# Patient Record
Sex: Male | Born: 1950
Health system: Southern US, Community
[De-identification: ages and names within clinical notes are randomized; demographics above are authoritative.]

## PROBLEM LIST (undated history)

## (undated) DIAGNOSIS — C61 Malignant neoplasm of prostate: Secondary | ICD-10-CM

## (undated) DIAGNOSIS — Z973 Presence of spectacles and contact lenses: Secondary | ICD-10-CM

## (undated) DIAGNOSIS — I10 Essential (primary) hypertension: Secondary | ICD-10-CM

## (undated) HISTORY — PX: COLONOSCOPY: SHX174

## (undated) HISTORY — PX: PROSTATE BIOPSY: SHX241

---

## 1990-03-14 HISTORY — PX: OTHER SURGICAL HISTORY: SHX169

## 2009-08-11 ENCOUNTER — Encounter: Admission: RE | Admit: 2009-08-11 | Discharge: 2009-08-11 | Payer: Self-pay | Admitting: Family Medicine

## 2014-01-21 ENCOUNTER — Emergency Department (HOSPITAL_COMMUNITY)
Admission: EM | Admit: 2014-01-21 | Discharge: 2014-01-21 | Disposition: A | Discharge status: Home / Self Care | Payer: BC Managed Care – PPO | Attending: Emergency Medicine | Admitting: Emergency Medicine

## 2014-01-21 ENCOUNTER — Encounter (HOSPITAL_COMMUNITY): Payer: Self-pay | Admitting: Emergency Medicine

## 2014-01-21 ENCOUNTER — Emergency Department (HOSPITAL_COMMUNITY): Payer: BC Managed Care – PPO

## 2014-01-21 DIAGNOSIS — Y9289 Other specified places as the place of occurrence of the external cause: Secondary | ICD-10-CM | POA: Insufficient documentation

## 2014-01-21 DIAGNOSIS — Y9389 Activity, other specified: Secondary | ICD-10-CM | POA: Insufficient documentation

## 2014-01-21 DIAGNOSIS — S61216A Laceration without foreign body of right little finger without damage to nail, initial encounter: Secondary | ICD-10-CM | POA: Insufficient documentation

## 2014-01-21 DIAGNOSIS — Y990 Civilian activity done for income or pay: Secondary | ICD-10-CM | POA: Insufficient documentation

## 2014-01-21 DIAGNOSIS — W11XXXA Fall on and from ladder, initial encounter: Secondary | ICD-10-CM | POA: Insufficient documentation

## 2014-01-21 DIAGNOSIS — S61214A Laceration without foreign body of right ring finger without damage to nail, initial encounter: Secondary | ICD-10-CM | POA: Insufficient documentation

## 2014-01-21 DIAGNOSIS — S3992XA Unspecified injury of lower back, initial encounter: Secondary | ICD-10-CM | POA: Insufficient documentation

## 2014-01-21 DIAGNOSIS — I10 Essential (primary) hypertension: Secondary | ICD-10-CM | POA: Insufficient documentation

## 2014-01-21 DIAGNOSIS — M79643 Pain in unspecified hand: Secondary | ICD-10-CM

## 2014-01-21 DIAGNOSIS — Z7982 Long term (current) use of aspirin: Secondary | ICD-10-CM | POA: Insufficient documentation

## 2014-01-21 DIAGNOSIS — S66821A Laceration of other specified muscles, fascia and tendons at wrist and hand level, right hand, initial encounter: Secondary | ICD-10-CM | POA: Insufficient documentation

## 2014-01-21 DIAGNOSIS — S61209A Unspecified open wound of unspecified finger without damage to nail, initial encounter: Secondary | ICD-10-CM

## 2014-01-21 DIAGNOSIS — W231XXA Caught, crushed, jammed, or pinched between stationary objects, initial encounter: Secondary | ICD-10-CM | POA: Insufficient documentation

## 2014-01-21 DIAGNOSIS — Z79899 Other long term (current) drug therapy: Secondary | ICD-10-CM | POA: Insufficient documentation

## 2014-01-21 DIAGNOSIS — Z880 Allergy status to penicillin: Secondary | ICD-10-CM | POA: Insufficient documentation

## 2014-01-21 DIAGNOSIS — S61419A Laceration without foreign body of unspecified hand, initial encounter: Secondary | ICD-10-CM

## 2014-01-21 DIAGNOSIS — S56129A Laceration of flexor muscle, fascia and tendon of unspecified finger at forearm level, initial encounter: Secondary | ICD-10-CM

## 2014-01-21 HISTORY — DX: Essential (primary) hypertension: I10

## 2014-01-21 MED ORDER — SULFAMETHOXAZOLE-TRIMETHOPRIM 800-160 MG PO TABS: 1.0000 | ORAL_TABLET | Freq: Two times a day (BID) | ORAL | Status: DC

## 2014-01-21 MED ORDER — HYDROCODONE-ACETAMINOPHEN 5-325 MG PO TABS
2.0000 | ORAL_TABLET | Freq: Once | ORAL | Status: AC
  Administered 2014-01-21: 2 via ORAL
  Filled 2014-01-21: qty 2

## 2014-01-21 MED ORDER — LIDOCAINE HCL (PF) 1 % IJ SOLN
30.0000 mL | Freq: Once | INTRAMUSCULAR | Status: DC
  Filled 2014-01-21: qty 30

## 2014-01-21 MED ORDER — HYDROCODONE-ACETAMINOPHEN 5-325 MG PO TABS: 1.0000 | ORAL_TABLET | ORAL | Status: DC | PRN

## 2014-01-21 MED ORDER — FENTANYL CITRATE 0.05 MG/ML IJ SOLN
50.0000 ug | INTRAMUSCULAR | Status: DC | PRN
  Administered 2014-01-21: 50 ug via INTRAVENOUS
  Filled 2014-01-21 (×2): qty 2

## 2014-01-21 NOTE — Discharge Instructions (Signed)
1. Medications: vicodin, bactrim, usual home medications 2. Treatment: rest, drink plenty of fluids, keep splint dry 3. Follow Up: Please Call Dr. Levell July office tomorrow to schedule an appointment for tomorrow or Friday for recheck and repair; return to the emergency department for fevers, worsening symptoms, splint changes if it becomes wet

## 2014-01-21 NOTE — ED Notes (Signed)
Laceration wrapped with lightly damp dressing.

## 2014-01-21 NOTE — ED Notes (Signed)
Hannah PA at bedside suturing hand.

## 2014-01-21 NOTE — ED Notes (Signed)
Pt [facility], pt fell 8 feet, landed on left side, right hand was crushed. Pt given 100 mcg of fentanyl upon arrival. Denies hitting head. Denies LOC. Pt is AAOX4, in NAD. Laceration to right hand, bleeding controlled. Tendons visible. Pt able to move all fingers except pinkie finger. Sensation intact.

## 2014-01-21 NOTE — Progress Notes (Signed)
Orthopedic Tech Progress Note Patient Details:  Logan Wu 09/27/1950 [id number]  Ortho Devices Type of Ortho Device: Ace wrap, Volar splint Ortho Device/Splint Location: RUE Ortho Device/Splint Interventions: Ordered, Application   Braulio Bosch 01/21/2014, 7:30 PM

## 2014-01-21 NOTE — ED Provider Notes (Signed)
CSN: [id number]     Arrival date & time 01/21/14  1153 History   First MD Initiated Contact with Patient 01/21/14 1155     Chief Complaint  Patient presents with  . Trauma  . Hand Injury     (Consider location/radiation/quality/duration/timing/severity/associated sxs/prior Treatment) Patient is a 63 y.o. male presenting with trauma and hand injury. The history is provided by the patient and medical records. No language interpreter was used.  Trauma   Current symptoms:      Associated symptoms:            Reports back pain (low, left).            Denies abdominal pain, chest pain, headache, nausea and vomiting.  Hand Injury Associated symptoms: back pain (low, left)   Associated symptoms: no fatigue and no fever      Logan Wu is a 63 y.o. male  with a hx of HTN presents to the Emergency Department complaining of acute, persistent with right hand laceration after accident at work onset 30 min PTA.  Patient reports he was 8 feet up on a ladder when the ladder fell and the organ cover he was attempting to remove "smashed" his hand.  Pt reports he hung there for several seconds before falling. He denies hitting his head or LOC.  He reports he fell onto his left side and has mild left lower back pain and left elbow pain, however his major concern in the large laceration to the dorsum and palmer sides of the hand.   Associated symptoms include numbness in the right little finger and decreased sensation in the ring and long fingers.  Fentanyl 150mcg given by EMS has alleviated the pain.  Pt denies fever, chills, saddle anesthesia, loss of bowel or bladder control, numbness/weakness in the legs, difficulty walking, syncope, vision changes, headache.      Past Medical History  Diagnosis Date  . Hypertension    Past Surgical History  Procedure Laterality Date  . Disk fusion     No family history on file. History  Substance Use Topics  . Smoking status: Never Smoker   . Smokeless  tobacco: Not on file  . Alcohol Use: Yes    Review of Systems  Constitutional: Negative for fever, diaphoresis, appetite change, fatigue and unexpected weight change.  HENT: Negative for mouth sores.   Eyes: Negative for visual disturbance.  Respiratory: Negative for cough, chest tightness, shortness of breath and wheezing.   Cardiovascular: Negative for chest pain.  Gastrointestinal: Negative for nausea, vomiting, abdominal pain, diarrhea and constipation.  Endocrine: Negative for polydipsia, polyphagia and polyuria.  Genitourinary: Negative for dysuria, urgency, frequency and hematuria.  Musculoskeletal: Positive for back pain (low, left) and arthralgias. Negative for neck stiffness.  Skin: Positive for wound. Negative for rash.  Allergic/Immunologic: Negative for immunocompromised state.  Neurological: Negative for syncope, light-headedness and headaches.  Hematological: Does not bruise/bleed easily.  Psychiatric/Behavioral: Negative for sleep disturbance. The patient is not nervous/anxious.       Allergies  Penicillins  Home Medications   Prior to Admission medications   Medication Sig Start Date End Date Taking? Authorizing Provider  aspirin 81 MG tablet Take 81 mg by mouth daily.   Yes Historical Provider, MD  cholecalciferol (VITAMIN D) 1000 UNITS tablet Take 1,000 Units by mouth daily.   Yes Historical Provider, MD  lisinopril (PRINIVIL,ZESTRIL) 40 MG tablet Take 40 mg by mouth daily. 01/14/14  Yes Historical Provider, MD  Multiple Vitamins-Minerals (MULTIVITAMIN WITH  MINERALS) tablet Take 1 tablet by mouth daily.   Yes Historical Provider, MD  verapamil (CALAN-SR) 180 MG CR tablet Take 360 mg by mouth daily. 01/14/14  Yes Historical Provider, MD  HYDROcodone-acetaminophen (NORCO/VICODIN) 5-325 MG per tablet Take 1 tablet by mouth every 4 (four) hours as needed for moderate pain or severe pain. 01/21/14   Jaykub Mackins, PA-C  sulfamethoxazole-trimethoprim (SEPTRA DS)  800-160 MG per tablet Take 1 tablet by mouth every 12 (twelve) hours. 01/21/14   Jeriel Vivanco, PA-C   BP 141/64 mmHg  Pulse 53  Temp(Src) 98.5 F (36.9 C) (Oral)  Resp 16  Ht 5\' 6"  (1.676 m)  Wt 162 lb (73.483 kg)  BMI 26.16 kg/m2  SpO2 99% Physical Exam  Constitutional: He is oriented to person, place, and time. He appears well-developed and well-nourished. No distress.  HENT:  Head: Normocephalic and atraumatic.  Nose: Nose normal.  Mouth/Throat: Uvula is midline, oropharynx is clear and moist and mucous membranes are normal.  Eyes: Conjunctivae and EOM are normal. Pupils are equal, round, and reactive to light.  Neck: Normal range of motion. No spinous process tenderness and no muscular tenderness present. No rigidity. Normal range of motion present.  Full ROM without pain No midline cervical tenderness No paraspinal tenderness  Cardiovascular: Normal rate, regular rhythm, normal heart sounds and intact distal pulses.   No murmur heard. Pulses:      Radial pulses are 2+ on the right side, and 2+ on the left side.       Dorsalis pedis pulses are 2+ on the right side, and 2+ on the left side.       Posterior tibial pulses are 2+ on the right side, and 2+ on the left side.  Pulmonary/Chest: Effort normal and breath sounds normal. No accessory muscle usage. No respiratory distress. He has no decreased breath sounds. He has no wheezes. He has no rhonchi. He has no rales. He exhibits no tenderness and no bony tenderness.  No contusion or ecchymosis No flail segment, crepitus or deformity Equal chest expansion  Abdominal: Soft. Normal appearance and bowel sounds are normal. There is no tenderness. There is no rigidity, no guarding and no CVA tenderness.  No contusion or ecchymosis Abd soft and nontender  Musculoskeletal: Normal range of motion.       Thoracic back: He exhibits normal range of motion.       Lumbar back: He exhibits normal range of motion.  Full range of motion  of the T-spine and L-spine No tenderness to palpation of the spinous processes of the T-spine or L-spine No tenderness to palpation of the paraspinous muscles of the L-spine Full ROM of the thumb, pointer finger, long finger and ring finger of the right hand; no flexion of the right little finger  Lymphadenopathy:    He has no cervical adenopathy.  Neurological: He is alert and oriented to person, place, and time. He has normal reflexes. No cranial nerve deficit. GCS eye subscore is 4. GCS verbal subscore is 5. GCS motor subscore is 6.  Reflex Scores:      Bicep reflexes are 2+ on the right side and 2+ on the left side.      Brachioradialis reflexes are 2+ on the right side and 2+ on the left side.      Patellar reflexes are 2+ on the right side and 2+ on the left side.      Achilles reflexes are 2+ on the right side and 2+ on the  left side. Speech is clear and goal oriented, follows commands Normal 5/5 strength in upper and lower extremities bilaterally including dorsiflexion and plantar flexion, strong and equal grip strength Sensation normal to light and sharp touch Moves extremities without ataxia, coordination intact Normal gait and balance No Clonus Sensation intact to the right thumb and pointer finger; decreased sensation in the right long, ring and little fingers - difficulty with 2 point discrimination in these fingers  5/5 strength with resisted flexion and extension of the right thumb, pointer finger, long finger, ring finger;  5/5 strength with resisted extension of the right little finger however 0/5 strength with flexion of the right little finger  Skin: Skin is warm and dry. No rash noted. He is not diaphoretic. No erythema.  Large avulsion to the dorsum of the right hand with visible tendons; tendons intact without visible lacerations Lacerations over the MCP joint on the palmar side of the right little, ring and long fingers  Psychiatric: He has a normal mood and affect.    Nursing note and vitals reviewed.   ED Course  LACERATION REPAIR Date/Time: 01/21/2014 7:19 PM Performed by: Abigail Butts Authorized by: Abigail Butts Consent: Verbal consent obtained. Risks and benefits: risks, benefits and alternatives were discussed Consent given by: patient Patient understanding: patient states understanding of the procedure being performed Patient consent: the patient's understanding of the procedure matches consent given Procedure consent: procedure consent matches procedure scheduled Relevant documents: relevant documents present and verified Site marked: the operative site was marked Imaging studies: imaging studies available Required items: required blood products, implants, devices, and special equipment available Patient identity confirmed: verbally with patient and arm band Time out: Immediately prior to procedure a "time out" was called to verify the correct patient, procedure, equipment, support staff and site/side marked as required. Body area: upper extremity Location details: right hand Laceration length: 12 cm Foreign bodies: no foreign bodies Tendon involvement: none Nerve involvement: none Vascular damage: no Anesthesia: local infiltration Local anesthetic: lidocaine 1% without epinephrine Anesthetic total: 8 ml Patient sedated: no Preparation: Patient was prepped and draped in the usual sterile fashion. Irrigation solution: saline Irrigation method: syringe Amount of cleaning: extensive Debridement: none Degree of undermining: none Wound skin closure material used: 5-0 vicryl rapide. Number of sutures: 16 Technique: simple Approximation: close Approximation difficulty: complex Dressing: 4x4 sterile gauze and splint Patient tolerance: Patient tolerated the procedure well with no immediate complications  LACERATION REPAIR Date/Time: 01/21/2014 7:20 PM Performed by: Abigail Butts Authorized by: Abigail Butts Consent: Verbal consent obtained. Risks and benefits: risks, benefits and alternatives were discussed Consent given by: patient Patient understanding: patient states understanding of the procedure being performed Patient consent: the patient's understanding of the procedure matches consent given Procedure consent: procedure consent matches procedure scheduled Relevant documents: relevant documents present and verified Site marked: the operative site was marked Imaging studies: imaging studies available Required items: required blood products, implants, devices, and special equipment available Patient identity confirmed: verbally with patient Time out: Immediately prior to procedure a "time out" was called to verify the correct patient, procedure, equipment, support staff and site/side marked as required. Body area: upper extremity Location details: right small finger Laceration length: 4 cm Foreign bodies: no foreign bodies Tendon involvement: complex Nerve involvement: superficial Vascular damage: no Anesthesia: local infiltration Local anesthetic: lidocaine 1% without epinephrine Anesthetic total: 5 ml Patient sedated: no Preparation: Patient was prepped and draped in the usual sterile fashion. Irrigation solution: saline Irrigation method: syringe Amount of  cleaning: extensive Debridement: none Degree of undermining: none Wound skin closure material used: 5-0 vicryl rapide. Number of sutures: 7 Technique: simple Approximation: close Approximation difficulty: complex Dressing: 4x4 sterile gauze and splint Patient tolerance: Patient tolerated the procedure well with no immediate complications  LACERATION REPAIR Date/Time: 01/21/2014 7:21 PM Performed by: Abigail Butts Authorized by: Abigail Butts Consent: Verbal consent obtained. Risks and benefits: risks, benefits and alternatives were discussed Consent given by: patient Patient understanding:  patient states understanding of the procedure being performed Patient consent: the patient's understanding of the procedure matches consent given Procedure consent: procedure consent matches procedure scheduled Relevant documents: relevant documents present and verified Site marked: the operative site was marked Imaging studies: imaging studies available Required items: required blood products, implants, devices, and special equipment available Patient identity confirmed: verbally with patient Time out: Immediately prior to procedure a "time out" was called to verify the correct patient, procedure, equipment, support staff and site/side marked as required. Body area: upper extremity Location details: right ring finger Laceration length: 3.5 cm Foreign bodies: no foreign bodies Tendon involvement: none Nerve involvement: superficial Vascular damage: no Anesthesia: local infiltration Local anesthetic: lidocaine 1% without epinephrine Anesthetic total: 4 ml Patient sedated: no Preparation: Patient was prepped and draped in the usual sterile fashion. Irrigation solution: saline Irrigation method: syringe Amount of cleaning: standard Wound skin closure material used: 5-0 vicryl rapide. Number of sutures: 4 Technique: simple Approximation: close Approximation difficulty: complex Dressing: 4x4 sterile gauze and splint Patient tolerance: Patient tolerated the procedure well with no immediate complications   (including critical care time) Labs Review Labs Reviewed - No data to display  Imaging Review Dg Lumbar Spine Complete  01/21/2014   CLINICAL DATA:  Fall, low back pain.  EXAM: LUMBAR SPINE - COMPLETE 4+ VIEW  COMPARISON:  None.  FINDINGS: Mild leftward scoliosis of the lumbar spine. No fracture. Degenerative disc changes in the mid scratch head degenerative disc and facet disease in the mid and lower lumbar spine. No fracture or malalignment. SI joints are symmetric and  unremarkable.  IMPRESSION: Degenerative disc and facet disease in the mid to lower lumbar spine. Mild levoscoliosis. No acute findings.   Electronically Signed   By: Rolm Baptise M.D.   On: 01/21/2014 14:14   Dg Wrist Complete Right  01/21/2014   CLINICAL DATA:  Hand pain after blunt trauma.  Laceration.  EXAM: RIGHT WRIST - COMPLETE 3+ VIEW  COMPARISON:  None.  FINDINGS: There is a large soft tissue defect in the dorsal hand. No evidence of acute fracture or radiopaque foreign body.  IMPRESSION: 1. No acute osseous findings. 2. Large soft tissue defect in the dorsal hand. No radiopaque foreign body.   Electronically Signed   By: Jorje Guild M.D.   On: 01/21/2014 13:59   Dg Hand Complete Right  01/21/2014   CLINICAL DATA:  HEAVY LID FELL ON RIGHT HAND.EXPOSED LIGAMENTS,LACERATION TO 3-5TH FINGER,ABRASION TO PALMER ASPECT  EXAM: RIGHT HAND - COMPLETE 3+ VIEW  COMPARISON:  None.  FINDINGS: There is no evidence of fracture or dislocation. There is no evidence of arthropathy. Lacerations along the dorsal right hand overlying the base of the metacarpals and at the MCP joints. Soft tissue laceration along the volar aspect of the third, fourth and fifth proximal phalanx with mild osseous irregularity at the proximal aspect of the fifth proximal phalanx suggesting osseous involvement.  IMPRESSION: Lacerations along the dorsal right hand overlying the base of the metacarpals and at the MCP joints.  Soft tissue laceration along the volar aspect of the third, fourth and fifth proximal phalanx with mild osseous irregularity at the proximal aspect of the fifth proximal phalanx suggesting osseous involvement.   Electronically Signed   By: Kathreen Devoid   On: 01/21/2014 13:59     EKG Interpretation None             MDM   Final diagnoses:  Hand laceration  Hand pain  Flexor tendon laceration, finger, open wound, initial encounter   Logan Wu presents with lacerations to the right hand and 8 ft  fall from ladder. Will image.  Last tetanus 2 years ago.    2:23 PM Hand x-ray with questionable fracture the proximal aspect of the fifth proximal phalanx; no acute abnormalities to the L spine or right wrist.  Patient remains a decrease in sensation in the right long ring and little fingers and without ability to flex the right little finger.  Will consult Dr. Fredna Dow.   3:00PM Discussed with Dr. Fredna Dow who requests repair of the lacerations and he will follow in the office tomorrow.    7:18 PM Tdap UTD.  Pressure irrigation performed. Laceration occurred < 8 hours prior to repair which was well tolerated. Pt has no co morbidities to effect normal wound healing. Discussed suture home care w pt and answered questions and pt placed in dorsal blocking splint. Patient ambulates here in the emergency department without difficulty. Remains neurologically intact.Pt to f-u with Dr. Fredna Dow in his office tomorrow or Friday.    I have personally reviewed patient's vitals, nursing note and any pertinent labs or imaging.  I performed an undressed physical exam.    It has been determined that no acute conditions requiring further emergency intervention are present at this time. The patient/guardian have been advised of the diagnosis and plan. I reviewed all labs and imaging including any potential incidental findings. We have discussed signs and symptoms that warrant return to the ED and they are listed in the discharge instructions.    Vital signs are stable at discharge.   BP 141/64 mmHg  Pulse 53  Temp(Src) 98.5 F (36.9 C) (Oral)  Resp 16  Ht 5\' 6"  (1.676 m)  Wt 162 lb (73.483 kg)  BMI 26.16 kg/m2  SpO2 99%       Abigail Butts, PA-C 01/21/14 [place], MD 01/24/14 [phone]

## 2014-01-22 ENCOUNTER — Encounter (HOSPITAL_BASED_OUTPATIENT_CLINIC_OR_DEPARTMENT_OTHER): Payer: Self-pay | Admitting: *Deleted

## 2014-01-22 NOTE — Progress Notes (Signed)
Pt lacerated hand-will come in for ekg-bmet-no cardiac or resp problems

## 2014-01-23 ENCOUNTER — Encounter (HOSPITAL_BASED_OUTPATIENT_CLINIC_OR_DEPARTMENT_OTHER)
Admission: RE | Admit: 2014-01-23 | Discharge: 2014-01-23 | Disposition: A | Discharge status: Home / Self Care | Payer: BC Managed Care – PPO | Source: Ambulatory Visit | Attending: Orthopedic Surgery | Admitting: Orthopedic Surgery

## 2014-01-23 ENCOUNTER — Other Ambulatory Visit: Payer: Self-pay

## 2014-01-23 ENCOUNTER — Other Ambulatory Visit: Payer: Self-pay | Admitting: Orthopedic Surgery

## 2014-01-23 DIAGNOSIS — Z880 Allergy status to penicillin: Secondary | ICD-10-CM | POA: Diagnosis not present

## 2014-01-23 DIAGNOSIS — S66126A Laceration of flexor muscle, fascia and tendon of right little finger at wrist and hand level, initial encounter: Secondary | ICD-10-CM | POA: Diagnosis present

## 2014-01-23 DIAGNOSIS — I10 Essential (primary) hypertension: Secondary | ICD-10-CM | POA: Diagnosis not present

## 2014-01-23 DIAGNOSIS — Z7982 Long term (current) use of aspirin: Secondary | ICD-10-CM | POA: Diagnosis not present

## 2014-01-23 DIAGNOSIS — Y9289 Other specified places as the place of occurrence of the external cause: Secondary | ICD-10-CM | POA: Diagnosis not present

## 2014-01-23 DIAGNOSIS — Y998 Other external cause status: Secondary | ICD-10-CM | POA: Diagnosis not present

## 2014-01-23 DIAGNOSIS — W458XXA Other foreign body or object entering through skin, initial encounter: Secondary | ICD-10-CM | POA: Diagnosis not present

## 2014-01-23 DIAGNOSIS — Y9389 Activity, other specified: Secondary | ICD-10-CM | POA: Diagnosis not present

## 2014-01-23 LAB — BASIC METABOLIC PANEL
ANION GAP: 14 (ref 5–15)
BUN: 17 mg/dL (ref 6–23)
CALCIUM: 9.7 mg/dL (ref 8.4–10.5)
CO2: 24 mEq/L (ref 19–32)
CREATININE: 1.11 mg/dL (ref 0.50–1.35)
Chloride: 101 mEq/L (ref 96–112)
GFR, EST AFRICAN AMERICAN: 80 mL/min — AB (ref >90)
GFR, EST NON AFRICAN AMERICAN: 69 mL/min — AB (ref >90)
Glucose, Bld: 114 mg/dL — ABNORMAL HIGH (ref 70–99)
Potassium: 4.3 mEq/L (ref 3.7–5.3)
SODIUM: 139 meq/L (ref 137–147)

## 2014-01-24 ENCOUNTER — Ambulatory Visit (HOSPITAL_BASED_OUTPATIENT_CLINIC_OR_DEPARTMENT_OTHER)
Admission: RE | Admit: 2014-01-24 | Discharge: 2014-01-24 | Disposition: A | Discharge status: Home / Self Care | Payer: BC Managed Care – PPO | Source: Ambulatory Visit | Attending: Orthopedic Surgery | Admitting: Orthopedic Surgery

## 2014-01-24 ENCOUNTER — Encounter (HOSPITAL_BASED_OUTPATIENT_CLINIC_OR_DEPARTMENT_OTHER)
Admission: RE | Disposition: A | Discharge status: Home / Self Care | Payer: Self-pay | Source: Ambulatory Visit | Attending: Orthopedic Surgery

## 2014-01-24 ENCOUNTER — Ambulatory Visit (HOSPITAL_BASED_OUTPATIENT_CLINIC_OR_DEPARTMENT_OTHER): Payer: BC Managed Care – PPO | Admitting: Anesthesiology

## 2014-01-24 ENCOUNTER — Encounter (HOSPITAL_BASED_OUTPATIENT_CLINIC_OR_DEPARTMENT_OTHER): Payer: Self-pay

## 2014-01-24 DIAGNOSIS — Z7982 Long term (current) use of aspirin: Secondary | ICD-10-CM | POA: Insufficient documentation

## 2014-01-24 DIAGNOSIS — W458XXA Other foreign body or object entering through skin, initial encounter: Secondary | ICD-10-CM | POA: Insufficient documentation

## 2014-01-24 DIAGNOSIS — Y9289 Other specified places as the place of occurrence of the external cause: Secondary | ICD-10-CM | POA: Insufficient documentation

## 2014-01-24 DIAGNOSIS — S66126A Laceration of flexor muscle, fascia and tendon of right little finger at wrist and hand level, initial encounter: Secondary | ICD-10-CM | POA: Insufficient documentation

## 2014-01-24 DIAGNOSIS — Z880 Allergy status to penicillin: Secondary | ICD-10-CM | POA: Insufficient documentation

## 2014-01-24 DIAGNOSIS — I10 Essential (primary) hypertension: Secondary | ICD-10-CM | POA: Insufficient documentation

## 2014-01-24 DIAGNOSIS — Y998 Other external cause status: Secondary | ICD-10-CM | POA: Insufficient documentation

## 2014-01-24 DIAGNOSIS — Y9389 Activity, other specified: Secondary | ICD-10-CM | POA: Insufficient documentation

## 2014-01-24 HISTORY — DX: Presence of spectacles and contact lenses: Z97.3

## 2014-01-24 HISTORY — PX: FLEXOR TENDON REPAIR: SHX6501

## 2014-01-24 LAB — POCT HEMOGLOBIN-HEMACUE: HEMOGLOBIN: 15.4 g/dL (ref 13.0–17.0)

## 2014-01-24 SURGERY — REPAIR, TENDON, FLEXOR
Anesthesia: Regional | Site: Hand | Laterality: Right

## 2014-01-24 MED ORDER — PHENYLEPHRINE HCL 10 MG/ML IJ SOLN
10.0000 mg | INTRAMUSCULAR | Status: DC | PRN
  Administered 2014-01-24: 50 ug/min via INTRAVENOUS

## 2014-01-24 MED ORDER — FENTANYL CITRATE 0.05 MG/ML IJ SOLN
INTRAMUSCULAR | Status: AC
  Filled 2014-01-24: qty 2

## 2014-01-24 MED ORDER — FENTANYL CITRATE 0.05 MG/ML IJ SOLN
50.0000 ug | INTRAMUSCULAR | Status: DC | PRN
  Administered 2014-01-24: 100 ug via INTRAVENOUS

## 2014-01-24 MED ORDER — BUPIVACAINE-EPINEPHRINE (PF) 0.5% -1:200000 IJ SOLN
INTRAMUSCULAR | Status: DC | PRN
  Administered 2014-01-24: 25 mL via PERINEURAL

## 2014-01-24 MED ORDER — EPHEDRINE SULFATE 50 MG/ML IJ SOLN
INTRAMUSCULAR | Status: DC | PRN
  Administered 2014-01-24: 15 mg via INTRAVENOUS
  Administered 2014-01-24: 10 mg via INTRAVENOUS

## 2014-01-24 MED ORDER — LIDOCAINE HCL (CARDIAC) 20 MG/ML IV SOLN
INTRAVENOUS | Status: DC | PRN
  Administered 2014-01-24: 50 mg via INTRAVENOUS

## 2014-01-24 MED ORDER — CHLORHEXIDINE GLUCONATE 4 % EX LIQD: 60.0000 mL | Freq: Once | CUTANEOUS | Status: DC

## 2014-01-24 MED ORDER — HYDROCODONE-ACETAMINOPHEN 5-325 MG PO TABS: ORAL_TABLET | ORAL | Status: DC

## 2014-01-24 MED ORDER — MIDAZOLAM HCL 2 MG/2ML IJ SOLN
1.0000 mg | INTRAMUSCULAR | Status: DC | PRN
  Administered 2014-01-24: 2 mg via INTRAVENOUS

## 2014-01-24 MED ORDER — ONDANSETRON HCL 4 MG/2ML IJ SOLN
INTRAMUSCULAR | Status: DC | PRN
  Administered 2014-01-24: 4 mg via INTRAVENOUS

## 2014-01-24 MED ORDER — PROPOFOL 10 MG/ML IV BOLUS
INTRAVENOUS | Status: DC | PRN
  Administered 2014-01-24: 150 mg via INTRAVENOUS

## 2014-01-24 MED ORDER — VANCOMYCIN HCL IN DEXTROSE 1-5 GM/200ML-% IV SOLN
INTRAVENOUS | Status: AC
  Filled 2014-01-24: qty 200

## 2014-01-24 MED ORDER — DEXAMETHASONE SODIUM PHOSPHATE 4 MG/ML IJ SOLN
INTRAMUSCULAR | Status: DC | PRN
  Administered 2014-01-24: 10 mg via INTRAVENOUS

## 2014-01-24 MED ORDER — LACTATED RINGERS IV SOLN
INTRAVENOUS | Status: DC
  Administered 2014-01-24: 13:00:00 via INTRAVENOUS

## 2014-01-24 MED ORDER — MIDAZOLAM HCL 2 MG/2ML IJ SOLN
INTRAMUSCULAR | Status: AC
  Filled 2014-01-24: qty 2

## 2014-01-24 MED ORDER — VANCOMYCIN HCL IN DEXTROSE 1-5 GM/200ML-% IV SOLN
1000.0000 mg | INTRAVENOUS | Status: AC
  Administered 2014-01-24: 1000 mg via INTRAVENOUS

## 2014-01-24 SURGICAL SUPPLY — 59 items
BAG DECANTER FOR FLEXI CONT (MISCELLANEOUS) IMPLANT
BANDAGE ELASTIC 3 VELCRO ST LF (GAUZE/BANDAGES/DRESSINGS) ×3 IMPLANT
BLADE MINI RND TIP GREEN BEAV (BLADE) IMPLANT
BLADE SURG 15 STRL LF DISP TIS (BLADE) ×4 IMPLANT
BLADE SURG 15 STRL SS (BLADE) ×2
BNDG ESMARK 4X9 LF (GAUZE/BANDAGES/DRESSINGS) ×3 IMPLANT
BNDG GAUZE ELAST 4 BULKY (GAUZE/BANDAGES/DRESSINGS) ×3 IMPLANT
CHLORAPREP W/TINT 26ML (MISCELLANEOUS) ×3 IMPLANT
CORDS BIPOLAR (ELECTRODE) ×3 IMPLANT
COVER BACK TABLE 60X90IN (DRAPES) ×3 IMPLANT
COVER MAYO STAND STRL (DRAPES) ×3 IMPLANT
CUFF TOURNIQUET SINGLE 18IN (TOURNIQUET CUFF) ×3 IMPLANT
DECANTER SPIKE VIAL GLASS SM (MISCELLANEOUS) ×3 IMPLANT
DRAPE EXTREMITY T 121X128X90 (DRAPE) ×3 IMPLANT
DRAPE SURG 17X23 STRL (DRAPES) ×3 IMPLANT
GAUZE SPONGE 4X4 12PLY STRL (GAUZE/BANDAGES/DRESSINGS) ×3 IMPLANT
GAUZE XEROFORM 1X8 LF (GAUZE/BANDAGES/DRESSINGS) ×3 IMPLANT
GLOVE BIO SURGEON STRL SZ7.5 (GLOVE) ×3 IMPLANT
GLOVE BIOGEL PI IND STRL 7.0 (GLOVE) ×4 IMPLANT
GLOVE BIOGEL PI IND STRL 8 (GLOVE) ×2 IMPLANT
GLOVE BIOGEL PI INDICATOR 7.0 (GLOVE) ×2
GLOVE BIOGEL PI INDICATOR 8 (GLOVE) ×1
GLOVE ECLIPSE 6.5 STRL STRAW (GLOVE) ×3 IMPLANT
GOWN STRL REUS W/ TWL LRG LVL3 (GOWN DISPOSABLE) ×2 IMPLANT
GOWN STRL REUS W/TWL LRG LVL3 (GOWN DISPOSABLE) ×1
GOWN STRL REUS W/TWL XL LVL3 (GOWN DISPOSABLE) ×3 IMPLANT
LOOP VESSEL MAXI BLUE (MISCELLANEOUS) IMPLANT
NDL SAFETY ECLIPSE 18X1.5 (NEEDLE) IMPLANT
NEEDLE HYPO 18GX1.5 SHARP (NEEDLE)
NEEDLE HYPO 25X1 1.5 SAFETY (NEEDLE) IMPLANT
NS IRRIG 1000ML POUR BTL (IV SOLUTION) ×3 IMPLANT
PACK BASIN DAY SURGERY FS (CUSTOM PROCEDURE TRAY) ×3 IMPLANT
PAD CAST 3X4 CTTN HI CHSV (CAST SUPPLIES) ×2 IMPLANT
PAD CAST 4YDX4 CTTN HI CHSV (CAST SUPPLIES) IMPLANT
PADDING CAST ABS 4INX4YD NS (CAST SUPPLIES) ×1
PADDING CAST ABS COTTON 4X4 ST (CAST SUPPLIES) ×2 IMPLANT
PADDING CAST COTTON 3X4 STRL (CAST SUPPLIES) ×1
PADDING CAST COTTON 4X4 STRL (CAST SUPPLIES)
SLEEVE SCD COMPRESS KNEE MED (MISCELLANEOUS) ×3 IMPLANT
SLING ARM LRG ADULT FOAM STRAP (SOFTGOODS) ×3 IMPLANT
SPEAR EYE SURG WECK-CEL (MISCELLANEOUS) ×3 IMPLANT
SPLINT PLASTER CAST XFAST 3X15 (CAST SUPPLIES) ×28 IMPLANT
SPLINT PLASTER XTRA FASTSET 3X (CAST SUPPLIES) ×14
STOCKINETTE 4X48 STRL (DRAPES) ×3 IMPLANT
SUT ETHIBOND 3-0 V-5 (SUTURE) IMPLANT
SUT ETHILON 4 0 PS 2 18 (SUTURE) ×9 IMPLANT
SUT FIBERWIRE 4-0 18 TAPR NDL (SUTURE) ×3
SUT MERSILENE 6 0 P 1 (SUTURE) IMPLANT
SUT NYLON 9 0 VRM6 (SUTURE) IMPLANT
SUT PROLENE 6 0 P 1 18 (SUTURE) ×3 IMPLANT
SUT SILK 4 0 PS 2 (SUTURE) ×3 IMPLANT
SUT STEEL 3 0 (SUTURE) ×3 IMPLANT
SUT SUPRAMID 4-0 (SUTURE) IMPLANT
SUT VICRYL 4-0 PS2 18IN ABS (SUTURE) IMPLANT
SUTURE FIBERWR 4-0 18 TAPR NDL (SUTURE) ×2 IMPLANT
SYR BULB 3OZ (MISCELLANEOUS) ×3 IMPLANT
SYR CONTROL 10ML LL (SYRINGE) IMPLANT
TOWEL OR 17X24 6PK STRL BLUE (TOWEL DISPOSABLE) ×6 IMPLANT
UNDERPAD 30X30 INCONTINENT (UNDERPADS AND DIAPERS) ×3 IMPLANT

## 2014-01-24 NOTE — Transfer of Care (Signed)
Immediate Anesthesia Transfer of Care Note  Patient: Logan Wu  Procedure(s) Performed: Procedure(s): RIGHT HAND,LONG,RING,SMALL FINGERS EXPLORATION WITH REPAIR  OF FLEXOR TENDON  SMALL FINGER (Right)  Patient Location: PACU  Anesthesia Type:General  Level of Consciousness: awake and sedated  Airway & Oxygen Therapy: Patient Spontanous Breathing and Patient connected to face mask oxygen  Post-op Assessment: Report given to PACU RN and Post -op Vital signs reviewed and stable  Post vital signs: Reviewed and stable  Complications: No apparent anesthesia complications

## 2014-01-24 NOTE — Anesthesia Postprocedure Evaluation (Signed)
  Anesthesia Post-op Note  Patient: Logan Wu  Procedure(s) Performed: Procedure(s): RIGHT HAND,LONG,RING,SMALL FINGERS EXPLORATION WITH REPAIR  OF FLEXOR TENDON  SMALL FINGER (Right)  Patient Location: PACU  Anesthesia Type: General, Regional   Level of Consciousness: awake, alert  and oriented  Airway and Oxygen Therapy: Patient Spontanous Breathing  Post-op Pain: none  Post-op Assessment: Post-op Vital signs reviewed  Post-op Vital Signs: Reviewed  Last Vitals:  Filed Vitals:   01/24/14 1515  BP: 118/54  Pulse: 69  Temp:   Resp: 19    Complications: No apparent anesthesia complications

## 2014-01-24 NOTE — Op Note (Signed)
[id number] 

## 2014-01-24 NOTE — Brief Op Note (Signed)
01/24/2014  2:53 PM  PATIENT:  Logan Wu  63 y.o. male  PRE-OPERATIVE DIAGNOSIS:  RIGHT LONG RING SMALL AND DORSUM HAND POSSIBLE TENDON/ARTERY NERVE LACERATION  POST-OPERATIVE DIAGNOSIS:  RIGHT LONG RING SMALL AND DORSUM HAND LACERATIONS, FLEXOR TENDON LACERATION SMALL FINGER  PROCEDURE:  Procedure(s): RIGHT HAND,LONG,RING,SMALL FINGERS EXPLORATION WITH REPAIR  OF FLEXOR TENDON  SMALL FINGER (Right)  SURGEON:  Surgeon(s) and Role:    * Leanora Cover, MD - Primary    * Daryll Brod, MD - Assisting  PHYSICIAN ASSISTANT:   ASSISTANTS: Daryll Brod, MD   ANESTHESIA:   regional and general  EBL:  Total I/O In: 1800 [I.V.:1800] Out: -   BLOOD ADMINISTERED:none  DRAINS: none   LOCAL MEDICATIONS USED:  NONE  SPECIMEN:  No Specimen  DISPOSITION OF SPECIMEN:  N/A  COUNTS:  YES  TOURNIQUET:   Total Tourniquet Time Documented: Upper Arm (Right) - 61 minutes Total: Upper Arm (Right) - 61 minutes   DICTATION: .Other Dictation: Dictation Number [id number]  PLAN OF CARE: Discharge to home after PACU  PATIENT DISPOSITION:  PACU - hemodynamically stable.

## 2014-01-24 NOTE — Anesthesia Preprocedure Evaluation (Addendum)
Anesthesia Evaluation  Patient identified by MRN, date of birth, ID band Patient awake    Reviewed: Allergy & Precautions, H&P , NPO status , Patient's Chart, lab work & pertinent test results  Airway Mallampati: I  TM Distance: >3 FB Neck ROM: Full    Dental no notable dental hx. (+) Teeth Intact, Dental Advisory Given   Pulmonary neg pulmonary ROS,  breath sounds clear to auscultation  Pulmonary exam normal       Cardiovascular hypertension, Pt. on medications Rhythm:Regular Rate:Normal     Neuro/Psych negative neurological ROS  negative psych ROS   GI/Hepatic negative GI ROS, Neg liver ROS,   Endo/Other  negative endocrine ROS  Renal/GU negative Renal ROS  negative genitourinary   Musculoskeletal   Abdominal   Peds  Hematology negative hematology ROS (+)   Anesthesia Other Findings   Reproductive/Obstetrics negative OB ROS                            Anesthesia Physical Anesthesia Plan  ASA: II  Anesthesia Plan: General and Regional   Post-op Pain Management:    Induction: Intravenous  Airway Management Planned: LMA  Additional Equipment:   Intra-op Plan:   Post-operative Plan: Extubation in OR  Informed Consent: I have reviewed the patients History and Physical, chart, labs and discussed the procedure including the risks, benefits and alternatives for the proposed anesthesia with the patient or authorized representative who has indicated his/her understanding and acceptance.   Dental advisory given  Plan Discussed with: CRNA, Surgeon and Anesthesiologist  Anesthesia Plan Comments:        Anesthesia Quick Evaluation

## 2014-01-24 NOTE — Anesthesia Procedure Notes (Addendum)
Procedure Name: LMA Insertion Performed by: Terrance Mass Pre-anesthesia Checklist: Patient identified, Timeout performed, Emergency Drugs available, Suction available and Patient being monitored Patient Re-evaluated:Patient Re-evaluated prior to inductionOxygen Delivery Method: Circle system utilized Preoxygenation: Pre-oxygenation with 100% oxygen Intubation Type: IV induction Ventilation: Mask ventilation without difficulty LMA: LMA inserted LMA Size: 4.0 Number of attempts: 1 Placement Confirmation: positive ETCO2 Tube secured with: Tape Dental Injury: Teeth and Oropharynx as per pre-operative assessment    Anesthesia Regional Block:  Supraclavicular block  Pre-Anesthetic Checklist: ,, timeout performed, Correct Patient, Correct Site, Correct Laterality, Correct Procedure, Correct Position, site marked, Risks and benefits discussed,  Surgical consent,  Pre-op evaluation,  At surgeon's request and post-op pain management  Laterality: Right and Upper  Prep: chloraprep       Needles:  Injection technique: Single-shot  Needle Type: Echogenic Stimulator Needle     Needle Length: 5cm 5 cm Needle Gauge: 21 and 21 G    Additional Needles:  Procedures: ultrasound guided (picture in chart) Supraclavicular block Narrative:  Start time: 01/24/2014 1:17 PM End time: 01/24/2014 1:24 PM Injection made incrementally with aspirations every 5 mL.  Performed by: Personally

## 2014-01-24 NOTE — H&P (Signed)
Logan Wu is an 63 y.o. male.   Chief Complaint: right hand lacerations HPI: 63 yo lhd male states he injured right hand in door for pipe organ causing volar and dorsal lacerations with possible tendon injury.  Seen in ED where wounds I&D'd and sutured.  Reports no previous injury to the hand.  Past Medical History  Diagnosis Date  . Hypertension   . Wears contact lenses     Past Surgical History  Procedure Laterality Date  . Disk fusion  1992    cervical  . Colonoscopy      History reviewed. No pertinent family history. Social History:  reports that he has never smoked. He does not have any smokeless tobacco history on file. He reports that he drinks alcohol. He reports that he does not use illicit drugs.  Allergies:  Allergies  Allergen Reactions  . Penicillins Rash    Medications Prior to Admission  Medication Sig Dispense Refill  . aspirin 81 MG tablet Take 81 mg by mouth daily.    . cholecalciferol (VITAMIN D) 1000 UNITS tablet Take 1,000 Units by mouth daily.    Marland Kitchen HYDROcodone-acetaminophen (NORCO/VICODIN) 5-325 MG per tablet Take 1 tablet by mouth every 4 (four) hours as needed for moderate pain or severe pain. 15 tablet 0  . lisinopril (PRINIVIL,ZESTRIL) 40 MG tablet Take 40 mg by mouth daily.  2  . Multiple Vitamins-Minerals (MULTIVITAMIN WITH MINERALS) tablet Take 1 tablet by mouth daily.    Marland Kitchen sulfamethoxazole-trimethoprim (SEPTRA DS) 800-160 MG per tablet Take 1 tablet by mouth every 12 (twelve) hours. 20 tablet 0  . verapamil (CALAN-SR) 180 MG CR tablet Take 180 mg by mouth 2 (two) times daily.   2    Results for orders placed or performed during the hospital encounter of 01/24/14 (from the past 48 hour(s))  Basic metabolic panel     Status: Abnormal   Collection Time: 01/23/14 12:00 PM  Result Value Ref Range   Sodium 139 137 - 147 mEq/L   Potassium 4.3 3.7 - 5.3 mEq/L   Chloride 101 96 - 112 mEq/L   CO2 24 19 - 32 mEq/L   Glucose, Bld 114 (H) 70 - 99  mg/dL   BUN 17 6 - 23 mg/dL   Creatinine, Ser 1.11 0.50 - 1.35 mg/dL   Calcium 9.7 8.4 - 10.5 mg/dL   GFR calc non Af Amer 69 (L) >90 mL/min   GFR calc Af Amer 80 (L) >90 mL/min    Comment: (NOTE) The eGFR has been calculated using the CKD EPI equation. This calculation has not been validated in all clinical situations. eGFR's persistently <90 mL/min signify possible Chronic Kidney Disease.    Anion gap 14 5 - 15  Hemoglobin-hemacue, POC     Status: None   Collection Time: 01/24/14 12:28 PM  Result Value Ref Range   Hemoglobin 15.4 13.0 - 17.0 g/dL    No results found.   A comprehensive review of systems was negative except for: Eyes: positive for contacts/glasses  Blood pressure 151/62, pulse 62, temperature 97.8 F (36.6 C), temperature source Oral, resp. rate 18, height 5' 6"  (1.676 m), weight 71.725 kg (158 lb 2 oz), SpO2 100 %.  General appearance: alert, cooperative and appears stated age Head: Normocephalic, without obvious abnormality, atraumatic Neck: supple, symmetrical, trachea midline Resp: clear to auscultation bilaterally Cardio: regular rate and rhythm GI:  non tender Extremities: decreased sensation in l/r/s fingers right hand.  unable to flex small finger.  volar  and dorsal lacerations with sutures in place. Pulses: 2+ and symmetric Skin: Skin color, texture, turgor normal. No rashes or lesions Neurologic: Grossly normal Incision/Wound: As above  Assessment/Plan Right hand lacerations with possible tendon/artery/nerve lacerations.  Recommend OR for exploration of wounds with repair tendon/artery/nerve as necessary.  Non operative and operative treatment options were discussed with the patient and patient wishes to proceed with operative treatment. Risks, benefits, and alternatives of surgery were discussed and the patient agrees with the plan of care.   Corwin Kuiken R 01/24/2014, 12:53 PM

## 2014-01-24 NOTE — Discharge Instructions (Addendum)
[facility] Instructions °Hand Surgery ° °Wound Care: °Keep your hand elevated above the level of your heart.  Do not allow it to dangle by your side.  Keep the dressing dry and do not remove it unless your doctor advises you to do so.  He will usually change it at the time of your post-op visit.  Moving your fingers is advised to stimulate circulation but will depend on the site of your surgery.  If you have a splint applied, your doctor will advise you regarding movement. ° °Activity: °Do not drive or operate machinery today.  Rest today and then you may return to your normal activity and work as indicated by your physician. ° °Diet:  °Drink liquids today or eat a light diet.  You may resume a regular diet tomorrow.   ° °General expectations: °Pain for two to three days. °Fingers may become slightly swollen. ° °Call your doctor if any of the following occur: °Severe pain not relieved by pain medication. °Elevated temperature. °Dressing soaked with blood. °Inability to move fingers. °White or bluish color to fingers. ° ° °Regional Anesthesia Blocks ° °1. Numbness or the inability to move the "blocked" extremity may last from 3-48 hours after placement. The length of time depends on the medication injected and your individual response to the medication. If the numbness is not going away after 48 hours, call your surgeon. ° °2. The extremity that is blocked will need to be protected until the numbness is gone and the  Strength has returned. Because you cannot feel it, you will need to take extra care to avoid injury. Because it may be weak, you may have difficulty moving it or using it. You may not know what position it is in without looking at it while the block is in effect. ° °3. For blocks in the legs and feet, returning to weight bearing and walking needs to be done carefully. You will need to wait until the numbness is entirely gone and the strength has returned. You should be able to move your leg and foot  normally before you try and bear weight or walk. You will need someone to be with you when you first try to ensure you do not fall and possibly risk injury. ° °4. Bruising and tenderness at the needle site are common side effects and will resolve in a few days. ° °5. Persistent numbness or new problems with movement should be communicated to the surgeon or the [facility] ([phone])/ [facility] ([phone]). ° ° °Post Anesthesia Home Care Instructions ° °Activity: °Get plenty of rest for the remainder of the day. A responsible adult should stay with you for 24 hours following the procedure.  °For the next 24 hours, DO NOT: °-Drive a car °-Operate machinery °-Drink alcoholic beverages °-Take any medication unless instructed by your physician °-Make any legal decisions or sign important papers. ° °Meals: °Start with liquid foods such as gelatin or soup. Progress to regular foods as tolerated. Avoid greasy, spicy, heavy foods. If nausea and/or vomiting occur, drink only clear liquids until the nausea and/or vomiting subsides. Call your physician if vomiting continues. ° °Special Instructions/Symptoms: °Your throat may feel dry or sore from the anesthesia or the breathing tube placed in your throat during surgery. If this causes discomfort, gargle with warm salt water. The discomfort should disappear within 24 hours. ° °

## 2014-01-24 NOTE — Progress Notes (Signed)
Assisted Dr. Al Corpus with right, ultrasound guided interscaline block. Side rails up, monitors on throughout procedure. See vital signs in flow sheet. Tolerated Procedure well.

## 2014-01-25 NOTE — Op Note (Signed)
NAMEROI, JAFARI NO.:  [id number]  MEDICAL RECORD NO.:  [id number]  LOCATION:                                 FACILITY:  PHYSICIAN:  Leanora Cover, MD        DATE OF BIRTH:  1951/01/21  DATE OF PROCEDURE:  01/24/2014 DATE OF DISCHARGE:                              OPERATIVE REPORT   PREOPERATIVE DIAGNOSIS:  Right hand dorsum and long ring and small finger lacerations with possible tendon, artery, nerve injury.  POSTOPERATIVE DIAGNOSIS:  Right dorsum of hand laceration, right long and ring finger lacerations, right small finger laceration with FDP laceration.  PROCEDURE:   1. Exploration of dorsum of hand wound 2. Exploration of ring finger wound 3. Exploration of small finger wound with repair of FDP within zone 2.  SURGEON:  Leanora Cover, MD  ASSISTANT:  Daryll Brod, M.D.  ANESTHESIA:  General with regional.  IV FLUIDS:  Per anesthesia flow sheet.  ESTIMATED BLOOD LOSS:  Minimal.  COMPLICATIONS:  None.  SPECIMENS:  None.  TOURNIQUET TIME:  61 minutes.  DISPOSITION:  Stable to PACU.  INDICATIONS:  Mr. Oregel is a 63 year old male, who states that earlier this week while going through the patch for the pipe organ, the hatch closed on his hand, causing lacerations.  He was seen at the emergency department, where the wounds were irrigated, debrided, and sutured.  He was followed up in the office.  Inability to flex the small finger.  He had numbness in the long, ring, and small fingertips.  I recommended going to the operating room for exploration of the wounds including the wound on the dorsum of the hand with repair of tendon, artery, and nerve as necessary.  Risks, benefits, and alternatives of the surgery were discussed including the risk of blood loss; infection; damage to nerves, vessels, tendons, ligaments, bone; failure of surgery; need for additional surgery; complications with wound healing; continued pain; continued numbness and  weakness.  He voiced understanding of these risks and elected to proceed.  OPERATIVE COURSE:  After being identified preoperatively by myself, the patient and I agreed upon the procedure and site of procedure.  Surgical site was marked.  The risks, benefits, and alternatives of the surgery were reviewed and he wished to proceed.  Surgical consent had been signed.  He was given IV vancomycin as preoperative antibiotic prophylaxis due to PENICILLIN allergy.  He was given a regional block by Anesthesia in preoperative holding area.  He was transported to the operating room and placed on the operating room table in a supine position with the right upper extremity on arm board.  General anesthesia was induced by Anesthesiology.  Right upper extremity was prepped and draped in normal sterile orthopedic fashion.  Surgical pause was performed between surgeons, anesthesia, and operating room staff, and all were in agreement as to the patient, procedure, and site of procedure.  The sutures had been removed prior to prepping and draping. The dorsal wound was explored first.  The extensor tendons to all digits were intact.  Some small amounts of detritus and hair were removed.  The wound was copiously irrigated with sterile saline.  The skin edges were freshened to remove devitalized skin.  The wound was then closed with 4- 0 nylon in a horizontal mattress fashion.  Attention was turned to the volar aspect of the hand.  The laceration on the long finger went into the dermis, but did not appear to violate through the subcutaneous tissues.  The wound in the ring finger was explored.  The radial digital nerve and artery were intact.  The FDP and FDS tendons were intact and the sheath was not violated.  The small finger wound was explored.  The radial and digital nerve and artery were identified and were intact. The FDS was intact.  The FDP was lacerated.  The wounds were copiously irrigated with  sterile saline.  The wound and the ring finger was repaired with 4-0 nylon in a horizontal mattress fashion.  The wound in the small finger was extended both proximally and distally.  The proximal stump of the FDP was able to be retrieved.  A modified Kessler suture was placed with a 4-0 FiberWire suture.  This was then advanced through the sheath to re-advance the tendon through the sheath and through the chiasm of the FDS.  The A3 pulley was split to allow repair. The distal stump was able to be produced by flexion of the DIP joint. The 4-0 FiberWire suture was used through the other half of the modified Kessler suture in the distal stump.  This was then tied off.  This approximated the tendon edges well.  A running 6-0 Prolene suture was used for an epitendinous suture.  There was no binding of the repair site on the remaining pulley system.  The skin was repaired with 4-0 nylon in a horizontal mattress fashion.  The wounds were all dressed with sterile Xeroform, 4x4s, and wrapped with a Kerlix bandage.  A dorsal blocking splint was placed with the wrist and MPs flexed and the IPs extended.  This was wrapped with Kerlix and Ace bandage.  Tourniquet was deflated at 61 minutes.  Fingertips were pink with brisk capillary refill after deflation of the tourniquet.  Operative drapes were broken down.  The patient was awoken from anesthesia safely.  He was transferred back to stretcher and taken to PACU in stable condition.  I will see him back in the office in 1 week for postoperative followup.  I will give him Norco 5/325, 1-2 p.o. q.6 hours p.r.n. pain, dispensed #40.     Leanora Cover, MD     KK/MEDQ  D:  01/24/2014  T:  01/25/2014  Job:  [id number]

## 2014-01-27 ENCOUNTER — Encounter (HOSPITAL_BASED_OUTPATIENT_CLINIC_OR_DEPARTMENT_OTHER): Payer: Self-pay | Admitting: Orthopedic Surgery

## 2015-05-19 DIAGNOSIS — E7800 Pure hypercholesterolemia, unspecified: Secondary | ICD-10-CM | POA: Diagnosis not present

## 2015-05-19 DIAGNOSIS — Z0000 Encounter for general adult medical examination without abnormal findings: Secondary | ICD-10-CM | POA: Diagnosis not present

## 2015-05-19 DIAGNOSIS — Z79899 Other long term (current) drug therapy: Secondary | ICD-10-CM | POA: Diagnosis not present

## 2015-05-19 DIAGNOSIS — Z23 Encounter for immunization: Secondary | ICD-10-CM | POA: Diagnosis not present

## 2015-05-19 DIAGNOSIS — I10 Essential (primary) hypertension: Secondary | ICD-10-CM | POA: Diagnosis not present

## 2015-05-19 DIAGNOSIS — Z125 Encounter for screening for malignant neoplasm of prostate: Secondary | ICD-10-CM | POA: Diagnosis not present

## 2015-07-13 DIAGNOSIS — M79605 Pain in left leg: Secondary | ICD-10-CM | POA: Diagnosis not present

## 2015-07-17 DIAGNOSIS — M5416 Radiculopathy, lumbar region: Secondary | ICD-10-CM | POA: Diagnosis not present

## 2015-07-31 DIAGNOSIS — M545 Low back pain: Secondary | ICD-10-CM | POA: Diagnosis not present

## 2015-07-31 DIAGNOSIS — M5416 Radiculopathy, lumbar region: Secondary | ICD-10-CM | POA: Diagnosis not present

## 2015-08-06 DIAGNOSIS — M5416 Radiculopathy, lumbar region: Secondary | ICD-10-CM | POA: Diagnosis not present

## 2015-08-06 DIAGNOSIS — M545 Low back pain: Secondary | ICD-10-CM | POA: Diagnosis not present

## 2015-08-14 DIAGNOSIS — M5416 Radiculopathy, lumbar region: Secondary | ICD-10-CM | POA: Diagnosis not present

## 2015-08-27 DIAGNOSIS — M5416 Radiculopathy, lumbar region: Secondary | ICD-10-CM | POA: Diagnosis not present

## 2015-08-27 DIAGNOSIS — M545 Low back pain: Secondary | ICD-10-CM | POA: Diagnosis not present

## 2015-12-08 DIAGNOSIS — Z23 Encounter for immunization: Secondary | ICD-10-CM | POA: Diagnosis not present

## 2015-12-31 DIAGNOSIS — L089 Local infection of the skin and subcutaneous tissue, unspecified: Secondary | ICD-10-CM | POA: Diagnosis not present

## 2015-12-31 DIAGNOSIS — L723 Sebaceous cyst: Secondary | ICD-10-CM | POA: Diagnosis not present

## 2016-02-05 DIAGNOSIS — R05 Cough: Secondary | ICD-10-CM | POA: Diagnosis not present

## 2016-06-01 DIAGNOSIS — M5416 Radiculopathy, lumbar region: Secondary | ICD-10-CM | POA: Diagnosis not present

## 2016-11-28 DIAGNOSIS — M5416 Radiculopathy, lumbar region: Secondary | ICD-10-CM | POA: Diagnosis not present

## 2017-01-05 DIAGNOSIS — Z23 Encounter for immunization: Secondary | ICD-10-CM | POA: Diagnosis not present

## 2017-01-13 DIAGNOSIS — M5416 Radiculopathy, lumbar region: Secondary | ICD-10-CM | POA: Diagnosis not present

## 2017-03-08 DIAGNOSIS — M5136 Other intervertebral disc degeneration, lumbar region: Secondary | ICD-10-CM | POA: Diagnosis not present

## 2017-03-08 DIAGNOSIS — E7800 Pure hypercholesterolemia, unspecified: Secondary | ICD-10-CM | POA: Diagnosis not present

## 2017-03-08 DIAGNOSIS — Z79899 Other long term (current) drug therapy: Secondary | ICD-10-CM | POA: Diagnosis not present

## 2017-03-08 DIAGNOSIS — Z0001 Encounter for general adult medical examination with abnormal findings: Secondary | ICD-10-CM | POA: Diagnosis not present

## 2017-03-08 DIAGNOSIS — Z23 Encounter for immunization: Secondary | ICD-10-CM | POA: Diagnosis not present

## 2017-03-08 DIAGNOSIS — I10 Essential (primary) hypertension: Secondary | ICD-10-CM | POA: Diagnosis not present

## 2017-03-08 DIAGNOSIS — Z125 Encounter for screening for malignant neoplasm of prostate: Secondary | ICD-10-CM | POA: Diagnosis not present

## 2017-07-11 DIAGNOSIS — M5416 Radiculopathy, lumbar region: Secondary | ICD-10-CM | POA: Diagnosis not present

## 2017-09-05 DIAGNOSIS — R972 Elevated prostate specific antigen [PSA]: Secondary | ICD-10-CM | POA: Diagnosis not present

## 2017-12-19 DIAGNOSIS — Z23 Encounter for immunization: Secondary | ICD-10-CM | POA: Diagnosis not present

## 2018-02-09 DIAGNOSIS — G5702 Lesion of sciatic nerve, left lower limb: Secondary | ICD-10-CM | POA: Diagnosis not present

## 2018-03-19 DIAGNOSIS — Z125 Encounter for screening for malignant neoplasm of prostate: Secondary | ICD-10-CM | POA: Diagnosis not present

## 2018-03-19 DIAGNOSIS — E7800 Pure hypercholesterolemia, unspecified: Secondary | ICD-10-CM | POA: Diagnosis not present

## 2018-03-19 DIAGNOSIS — Z79899 Other long term (current) drug therapy: Secondary | ICD-10-CM | POA: Diagnosis not present

## 2018-03-19 DIAGNOSIS — R7301 Impaired fasting glucose: Secondary | ICD-10-CM | POA: Diagnosis not present

## 2018-03-19 DIAGNOSIS — I10 Essential (primary) hypertension: Secondary | ICD-10-CM | POA: Diagnosis not present

## 2018-03-19 DIAGNOSIS — Z23 Encounter for immunization: Secondary | ICD-10-CM | POA: Diagnosis not present

## 2018-03-19 DIAGNOSIS — Z0001 Encounter for general adult medical examination with abnormal findings: Secondary | ICD-10-CM | POA: Diagnosis not present

## 2018-08-21 DIAGNOSIS — R61 Generalized hyperhidrosis: Secondary | ICD-10-CM | POA: Diagnosis not present

## 2018-08-21 DIAGNOSIS — I10 Essential (primary) hypertension: Secondary | ICD-10-CM | POA: Diagnosis not present

## 2018-08-22 DIAGNOSIS — D649 Anemia, unspecified: Secondary | ICD-10-CM | POA: Diagnosis not present

## 2018-08-22 DIAGNOSIS — R61 Generalized hyperhidrosis: Secondary | ICD-10-CM | POA: Diagnosis not present

## 2019-03-12 DIAGNOSIS — R509 Fever, unspecified: Secondary | ICD-10-CM | POA: Diagnosis not present

## 2019-03-13 DIAGNOSIS — R52 Pain, unspecified: Secondary | ICD-10-CM | POA: Diagnosis not present

## 2019-03-13 DIAGNOSIS — Z03818 Encounter for observation for suspected exposure to other biological agents ruled out: Secondary | ICD-10-CM | POA: Diagnosis not present

## 2019-03-14 DIAGNOSIS — L03116 Cellulitis of left lower limb: Secondary | ICD-10-CM | POA: Diagnosis not present

## 2019-03-27 DIAGNOSIS — M5136 Other intervertebral disc degeneration, lumbar region: Secondary | ICD-10-CM | POA: Diagnosis not present

## 2019-03-27 DIAGNOSIS — I10 Essential (primary) hypertension: Secondary | ICD-10-CM | POA: Diagnosis not present

## 2019-03-27 DIAGNOSIS — Z79899 Other long term (current) drug therapy: Secondary | ICD-10-CM | POA: Diagnosis not present

## 2019-03-27 DIAGNOSIS — E7800 Pure hypercholesterolemia, unspecified: Secondary | ICD-10-CM | POA: Diagnosis not present

## 2019-03-27 DIAGNOSIS — Z0000 Encounter for general adult medical examination without abnormal findings: Secondary | ICD-10-CM | POA: Diagnosis not present

## 2019-04-10 DIAGNOSIS — E7800 Pure hypercholesterolemia, unspecified: Secondary | ICD-10-CM | POA: Diagnosis not present

## 2019-04-10 DIAGNOSIS — Z79899 Other long term (current) drug therapy: Secondary | ICD-10-CM | POA: Diagnosis not present

## 2019-04-10 DIAGNOSIS — Z0000 Encounter for general adult medical examination without abnormal findings: Secondary | ICD-10-CM | POA: Diagnosis not present

## 2019-04-10 DIAGNOSIS — Z125 Encounter for screening for malignant neoplasm of prostate: Secondary | ICD-10-CM | POA: Diagnosis not present

## 2019-04-11 ENCOUNTER — Ambulatory Visit: Payer: Self-pay

## 2019-04-19 ENCOUNTER — Ambulatory Visit: Payer: PPO | Attending: Internal Medicine

## 2019-04-19 DIAGNOSIS — Z23 Encounter for immunization: Secondary | ICD-10-CM | POA: Insufficient documentation

## 2019-04-19 NOTE — Progress Notes (Signed)
   Covid-19 Vaccination Clinic  Name:  Logan Wu    MRN: [id number] DOB: 10-Sep-1950  04/19/2019  [name] was observed post Covid-19 immunization for 15 minutes without incidence. He was provided with Vaccine Information Sheet and instruction to access the V-Safe system.   [name] was instructed to call 911 with any severe reactions post vaccine: Marland Kitchen Difficulty breathing  . Swelling of your face and throat  . A fast heartbeat  . A bad rash all over your body  . Dizziness and weakness    Immunizations Administered    Name Date Dose VIS Date Route   Pfizer COVID-19 Vaccine 04/19/2019  6:06 PM 0.3 mL 02/22/2019 Intramuscular   Manufacturer: [place]   Lot: [id number]   [place]: [id number]

## 2019-04-22 ENCOUNTER — Ambulatory Visit: Payer: Self-pay

## 2019-04-23 DIAGNOSIS — Z1159 Encounter for screening for other viral diseases: Secondary | ICD-10-CM | POA: Diagnosis not present

## 2019-04-26 DIAGNOSIS — K5730 Diverticulosis of large intestine without perforation or abscess without bleeding: Secondary | ICD-10-CM | POA: Diagnosis not present

## 2019-04-26 DIAGNOSIS — K640 First degree hemorrhoids: Secondary | ICD-10-CM | POA: Diagnosis not present

## 2019-04-26 DIAGNOSIS — Z1211 Encounter for screening for malignant neoplasm of colon: Secondary | ICD-10-CM | POA: Diagnosis not present

## 2019-04-26 DIAGNOSIS — D125 Benign neoplasm of sigmoid colon: Secondary | ICD-10-CM | POA: Diagnosis not present

## 2019-04-30 DIAGNOSIS — D125 Benign neoplasm of sigmoid colon: Secondary | ICD-10-CM | POA: Diagnosis not present

## 2019-05-10 DIAGNOSIS — R972 Elevated prostate specific antigen [PSA]: Secondary | ICD-10-CM | POA: Diagnosis not present

## 2019-05-14 ENCOUNTER — Ambulatory Visit: Payer: PPO

## 2019-05-14 ENCOUNTER — Ambulatory Visit: Payer: PPO | Attending: Internal Medicine

## 2019-05-14 DIAGNOSIS — Z23 Encounter for immunization: Secondary | ICD-10-CM | POA: Insufficient documentation

## 2019-05-14 NOTE — Progress Notes (Signed)
   Covid-19 Vaccination Clinic  Name:  TAYM MELOY    MRN: [id number] DOB: 1950-08-27  05/14/2019  Mr. Stopa was observed post Covid-19 immunization for 15 minutes without incident. He was provided with Vaccine Information Sheet and instruction to access the V-Safe system.   Mr. Knupp was instructed to call 911 with any severe reactions post vaccine: Marland Kitchen Difficulty breathing  . Swelling of face and throat  . A fast heartbeat  . A bad rash all over body  . Dizziness and weakness   Immunizations Administered    Name Date Dose VIS Date Route   Pfizer COVID-19 Vaccine 05/14/2019  8:20 AM 0.3 mL 02/22/2019 Intramuscular   Manufacturer: [place]   Lot: [id number]   [place]: [id number]

## 2019-06-10 DIAGNOSIS — R972 Elevated prostate specific antigen [PSA]: Secondary | ICD-10-CM | POA: Diagnosis not present

## 2019-06-10 DIAGNOSIS — C61 Malignant neoplasm of prostate: Secondary | ICD-10-CM | POA: Diagnosis not present

## 2019-06-20 DIAGNOSIS — C61 Malignant neoplasm of prostate: Secondary | ICD-10-CM | POA: Diagnosis not present

## 2019-07-12 ENCOUNTER — Ambulatory Visit: Payer: PPO

## 2019-07-12 ENCOUNTER — Encounter: Payer: Self-pay | Admitting: Medical Oncology

## 2019-07-12 ENCOUNTER — Ambulatory Visit
Admission: RE | Admit: 2019-07-12 | Discharge: 2019-07-12 | Disposition: A | Discharge status: Home / Self Care | Payer: PPO | Source: Ambulatory Visit | Attending: Radiation Oncology | Admitting: Radiation Oncology

## 2019-07-12 ENCOUNTER — Encounter: Payer: Self-pay | Admitting: Radiation Oncology

## 2019-07-12 ENCOUNTER — Ambulatory Visit: Payer: PPO | Admitting: Radiation Oncology

## 2019-07-12 ENCOUNTER — Other Ambulatory Visit: Payer: Self-pay

## 2019-07-12 VITALS — Ht 66.0 in | Wt 154.5 lb

## 2019-07-12 DIAGNOSIS — C61 Malignant neoplasm of prostate: Secondary | ICD-10-CM | POA: Insufficient documentation

## 2019-07-12 DIAGNOSIS — R972 Elevated prostate specific antigen [PSA]: Secondary | ICD-10-CM | POA: Diagnosis not present

## 2019-07-12 HISTORY — DX: Malignant neoplasm of prostate: C61

## 2019-07-12 NOTE — Progress Notes (Signed)
Radiation Oncology         [phone] ________________________________  Initial Outpatient Consultation - Conducted via MyChart due to current COVID-19 concerns for limiting patient exposure  Name: Logan Wu MRN: [id number]  Date: 07/12/2019  DOB: 07/04/1950  [id number], [place], MD  Davis Gourd*   REFERRING PHYSICIAN: Davis Gourd*  DIAGNOSIS: 69 y.o. gentleman with Stage T1c adenocarcinoma of the prostate with Gleason score of 3+3, and PSA of 4.04.    ICD-10-CM   1. Malignant neoplasm of prostate ([place])  C61     HISTORY OF PRESENT ILLNESS: Logan Wu is a 69 y.o. male with a diagnosis of prostate cancer. He was noted to have an elevated PSA of 4.04 by his primary care physician, Dr. Dorthy Cooler.  Previous PSA values were 3.23 in January 2020 and 2.48 in June 2019.  He also has a family history of prostate cancer in his brother who was diagnosed at the age of 35.  Accordingly, he was referred for evaluation in urology by Dr. Lovena Neighbours on 05/10/2019,  digital rectal examination was performed at that time revealing no nodularity.  The patient proceeded to transrectal ultrasound with 12 biopsies of the prostate on 06/10/2019.  The prostate volume measured 38 cc.  Out of 12 core biopsies, 1 was positive.  The maximum Gleason score was 3+3, and this was seen in just 5% of left mid lateral. No other areas of atypia or dysplasia were seen.  The patient reviewed the biopsy results with his urologist and he has kindly been referred today for discussion of potential radiation treatment options.   PREVIOUS RADIATION THERAPY: No  PAST MEDICAL HISTORY:  Past Medical History:  Diagnosis Date  . Hypertension   . Prostate cancer ([place])   . Wears contact lenses       PAST SURGICAL HISTORY: Past Surgical History:  Procedure Laterality Date  . COLONOSCOPY    . disk fusion  1992   cervical  . FLEXOR TENDON REPAIR Right 01/24/2014   Procedure: RIGHT HAND,LONG,RING,SMALL  FINGERS EXPLORATION WITH REPAIR  OF FLEXOR TENDON  SMALL FINGER;  Surgeon: Leanora Cover, MD;  Location: [facility];  Service: Orthopedics;  Laterality: Right;  . PROSTATE BIOPSY      FAMILY HISTORY:  Family History  Problem Relation Age of Onset  . Prostate cancer Brother   . Breast cancer Neg Hx   . Colon cancer Neg Hx   . Pancreatic cancer Neg Hx     SOCIAL HISTORY:  Social History   Socioeconomic History  . Marital status: Married    Spouse name: Not on file  . Number of children: 3  . Years of education: Not on file  . Highest education level: Not on file  Occupational History    Comment: retired  Tobacco Use  . Smoking status: Never Smoker  . Smokeless tobacco: Never Used  Substance and Sexual Activity  . Alcohol use: Yes    Alcohol/week: 1.0 standard drinks    Types: 1 Cans of beer per week  . Drug use: No  . Sexual activity: Yes  Other Topics Concern  . Not on file  Social History Narrative  . Not on file   Social Determinants of Health   Financial Resource Strain:   . Difficulty of Paying Living Expenses:   Food Insecurity:   . Worried About Charity fundraiser in the Last Year:   . Arboriculturist in the Last Year:   [employer]  Needs:   . Lack of Transportation (Medical):   Marland Kitchen Lack of Transportation (Non-Medical):   Physical Activity:   . Days of Exercise per Week:   . Minutes of Exercise per Session:   Stress:   . Feeling of Stress :   Social Connections:   . Frequency of Communication with Friends and Family:   . Frequency of Social Gatherings with Friends and Family:   . Attends Religious Services:   . Active Member of Clubs or Organizations:   . Attends Archivist Meetings:   Marland Kitchen Marital Status:   Intimate Partner Violence:   . Fear of Current or Ex-Partner:   . Emotionally Abused:   Marland Kitchen Physically Abused:   . Sexually Abused:     ALLERGIES: Penicillins  MEDICATIONS:  Current Outpatient Medications  Medication  Sig Dispense Refill  . atorvastatin (LIPITOR) 20 MG tablet Take 20 mg by mouth daily.    . cholecalciferol (VITAMIN D) 1000 UNITS tablet Take 1,000 Units by mouth daily.    Marland Kitchen lisinopril (PRINIVIL,ZESTRIL) 40 MG tablet Take 40 mg by mouth daily.  2  . Multiple Vitamins-Minerals (MULTIVITAMIN WITH MINERALS) tablet Take 1 tablet by mouth daily.    Marland Kitchen doxycycline (VIBRA-TABS) 100 MG tablet Take 100 mg by mouth 2 (two) times daily.    . verapamil (VERELAN PM) 240 MG 24 hr capsule Take 240 mg by mouth daily.     No current facility-administered medications for this encounter.    REVIEW OF SYSTEMS:  On review of systems, the patient reports that he is doing well overall. He denies any chest pain, shortness of breath, cough, fevers, chills, night sweats, unintended weight changes. He denies any bowel disturbances, and denies abdominal pain, nausea or vomiting. He denies any new musculoskeletal or joint aches or pains. His IPSS was 1, indicating minimal urinary symptoms. He reports nocturia, his only symptom, began about 3 months ago. His SHIM was 25, indicating he does not have erectile dysfunction. A complete review of systems is obtained and is otherwise negative.    PHYSICAL EXAM:  Wt Readings from Last 3 Encounters:  07/12/19 154 lb 8 oz (70.1 kg)  01/24/14 158 lb 2 oz (71.7 kg)  01/21/14 162 lb (73.5 kg)   Temp Readings from Last 3 Encounters:  01/24/14 98.5 F (36.9 C)  01/21/14 98.5 F (36.9 C) (Oral)   BP Readings from Last 3 Encounters:  01/24/14 (!) 141/50  01/21/14 141/64   Pulse Readings from Last 3 Encounters:  01/24/14 75  01/21/14 (!) 53   Pain Assessment Pain Score: 0-No pain/10  In general this is a well appearing Caucasian gentleman in no acute distress. He's alert and oriented x4 and appropriate throughout the examination. Cardiopulmonary assessment is negative for acute distress and he exhibits normal effort.    KPS = 100  100 - Normal; no complaints; no evidence  of disease. 90   - Able to carry on normal activity; minor signs or symptoms of disease. 80   - Normal activity with effort; some signs or symptoms of disease. 32   - Cares for self; unable to carry on normal activity or to do active work. 60   - Requires occasional assistance, but is able to care for most of his personal needs. 50   - Requires considerable assistance and frequent medical care. 23   - Disabled; requires special care and assistance. 88   - Severely disabled; hospital admission is indicated although death not imminent. 20   -  Very sick; hospital admission necessary; active supportive treatment necessary. 10   - Moribund; fatal processes progressing rapidly. 0     - Dead  Karnofsky DA, Abelmann [place], Craver LS and Burchenal [facility] [phone]) The use of the nitrogen mustards in the palliative treatment of carcinoma: with particular reference to bronchogenic carcinoma Cancer 1 [phone]  LABORATORY DATA:  Lab Results  Component Value Date   HGB 15.4 01/24/2014   Lab Results  Component Value Date   NA 139 01/23/2014   K 4.3 01/23/2014   CL 101 01/23/2014   CO2 24 01/23/2014   No results found for: ALT, AST, GGT, ALKPHOS, BILITOT   RADIOGRAPHY: No results found.    IMPRESSION/PLAN: This visit was conducted via MyChart to spare the patient unnecessary potential exposure in the healthcare setting during the current COVID-19 pandemic. 1. 69 y.o. gentleman with Stage T1c adenocarcinoma of the prostate with Gleason Score of 3+3, and PSA of 4.04. We discussed the patient's workup and outlined the nature of prostate cancer in this setting. The patient's T stage, Gleason's score, and PSA put him into the very low risk group. Accordingly, he is eligible for a variety of potential treatment options including active surveillance, brachytherapy, 5.5 weeks of external radiation, or prostatectomy.  We discussed the guideline based recommendation for active surveillance in patients with such low  volume, low risk prostate cancer which we endorse.  He understands that active surveillance generally involves periodic repeat prostate biopsies and occasionally prostate MRI scans to allow for more targeted prostate biopsies.  We also discussed the available radiation techniques, and focused on the details and logistics of delivery as well as the risks, benefits, short and long-term effects associated with radiotherapy and compared and contrasted these with prostatectomy. We briefly discussed the role of SpaceOAR in reducing the rectal toxicity associated with radiotherapy.  He was encouraged to ask questions that were answered to his stated satisfaction.  At the end of the conversation, the patient is interested in moving forward with active surveillance which we are in full support of and in agreement with. We will share our discussion with Dr. Lovena Neighbours and look forward to following along in the care of this very nice gentleman.  We enjoyed meeting him today and of course, would be more than happy to continue to participate in his care if clinically indicated in the future.    Given current concerns for patient exposure during the COVID-19 pandemic, this encounter was conducted via video-enabled MyChart visit. The patient has given verbal consent for this type of encounter. The time spent during this encounter was 45 minutes. The attendants for this meeting include Tyler Pita MD, Ashlyn Bruning PA-C, [facility], and patient, Logan Wu. During the encounter, Tyler Pita MD, Ashlyn Bruning PA-C, and scribe, Wilburn Mylar were located at [place].  Patient, Logan Wu was located at home.    Nicholos Johns, PA-C    Tyler Pita, MD  [facility] Direct Dial: [phone]  Fax: [phone] .com  Skype  LinkedIn  This document serves as a record of services personally performed  by Tyler Pita, MD and Freeman Caldron, PA-C. It was created on their behalf by Wilburn Mylar, a trained medical scribe. The creation of this record is based on the scribe's personal observations and the provider's statements to them. This document has been checked and approved by the attending provider.

## 2019-07-12 NOTE — Progress Notes (Signed)
GU Location of Tumor / Histology: prostatic adenocarcinoma  If Prostate Cancer, Gleason Score is (3 + 3) and PSA is (4.04) on 04/10/19. Prostate volume: 38 grams.    Logan Wu was referred by PCP, Dr. Dorthy Cooler to Dr. Lovena Neighbours for further evaluation of an elevated PSA.     Biopsies of prostate (if applicable) revealed:   Past/Anticipated interventions by urology, if any: prostate biopsy, referral for consideration of radiation therapy  Dr. Lovena Neighbours encouraged active surveillance with repeat psa in 4 months and repeat MRI of prostate in 12 months.   Past/Anticipated interventions by medical oncology, if any: no  Weight changes, if any: no  Bowel/Bladder complaints, if any: IPSS 1 with nocturia. Patient reports nocturia only began 3 months ago. SHIM 25. Denies dysuria or hematuria. Denies urinary leakage or incontinence. Denies any bowel complaints.   Nausea/Vomiting, if any: no  Pain issues, if any:  denies  SAFETY ISSUES:  Prior radiation? denies  Pacemaker/ICD? Denies.  Possible current pregnancy? no, male patient  Is the patient on methotrexate? no  Current Complaints / other details:  69 year old male. Married with 1 daughter and 2 sons.

## 2019-11-26 DIAGNOSIS — Z23 Encounter for immunization: Secondary | ICD-10-CM | POA: Diagnosis not present

## 2020-02-19 DIAGNOSIS — C61 Malignant neoplasm of prostate: Secondary | ICD-10-CM | POA: Diagnosis not present

## 2020-03-24 DIAGNOSIS — E7800 Pure hypercholesterolemia, unspecified: Secondary | ICD-10-CM | POA: Diagnosis not present

## 2020-03-24 DIAGNOSIS — Z79899 Other long term (current) drug therapy: Secondary | ICD-10-CM | POA: Diagnosis not present

## 2020-03-24 DIAGNOSIS — M5136 Other intervertebral disc degeneration, lumbar region: Secondary | ICD-10-CM | POA: Diagnosis not present

## 2020-03-24 DIAGNOSIS — I10 Essential (primary) hypertension: Secondary | ICD-10-CM | POA: Diagnosis not present

## 2020-03-24 DIAGNOSIS — Z0001 Encounter for general adult medical examination with abnormal findings: Secondary | ICD-10-CM | POA: Diagnosis not present

## 2020-04-07 DIAGNOSIS — Z79899 Other long term (current) drug therapy: Secondary | ICD-10-CM | POA: Diagnosis not present

## 2020-06-08 ENCOUNTER — Other Ambulatory Visit: Payer: Self-pay | Admitting: Urology

## 2020-06-08 DIAGNOSIS — C61 Malignant neoplasm of prostate: Secondary | ICD-10-CM

## 2020-06-22 ENCOUNTER — Other Ambulatory Visit: Payer: Self-pay

## 2020-06-22 ENCOUNTER — Ambulatory Visit
Admission: RE | Admit: 2020-06-22 | Discharge: 2020-06-22 | Disposition: A | Discharge status: Home / Self Care | Payer: PPO | Source: Ambulatory Visit | Attending: Urology | Admitting: Urology

## 2020-06-22 DIAGNOSIS — C61 Malignant neoplasm of prostate: Secondary | ICD-10-CM

## 2020-06-22 DIAGNOSIS — N400 Enlarged prostate without lower urinary tract symptoms: Secondary | ICD-10-CM | POA: Diagnosis not present

## 2020-06-22 DIAGNOSIS — N402 Nodular prostate without lower urinary tract symptoms: Secondary | ICD-10-CM | POA: Diagnosis not present

## 2020-06-22 DIAGNOSIS — K4090 Unilateral inguinal hernia, without obstruction or gangrene, not specified as recurrent: Secondary | ICD-10-CM | POA: Diagnosis not present

## 2020-06-22 MED ORDER — GADOBENATE DIMEGLUMINE 529 MG/ML IV SOLN
14.0000 mL | Freq: Once | INTRAVENOUS | Status: AC | PRN
  Administered 2020-06-22: 14 mL via INTRAVENOUS

## 2020-06-25 DIAGNOSIS — R972 Elevated prostate specific antigen [PSA]: Secondary | ICD-10-CM | POA: Diagnosis not present

## 2020-06-25 DIAGNOSIS — C61 Malignant neoplasm of prostate: Secondary | ICD-10-CM | POA: Diagnosis not present

## 2020-09-03 DIAGNOSIS — C61 Malignant neoplasm of prostate: Secondary | ICD-10-CM | POA: Diagnosis not present

## 2020-09-11 DIAGNOSIS — C61 Malignant neoplasm of prostate: Secondary | ICD-10-CM | POA: Diagnosis not present

## 2020-09-22 ENCOUNTER — Other Ambulatory Visit (HOSPITAL_COMMUNITY): Payer: Self-pay | Admitting: Family Medicine

## 2020-09-22 DIAGNOSIS — I10 Essential (primary) hypertension: Secondary | ICD-10-CM | POA: Diagnosis not present

## 2020-09-22 DIAGNOSIS — Z79899 Other long term (current) drug therapy: Secondary | ICD-10-CM | POA: Diagnosis not present

## 2020-09-22 DIAGNOSIS — R42 Dizziness and giddiness: Secondary | ICD-10-CM

## 2020-09-22 DIAGNOSIS — E7800 Pure hypercholesterolemia, unspecified: Secondary | ICD-10-CM | POA: Diagnosis not present

## 2020-09-30 ENCOUNTER — Ambulatory Visit (HOSPITAL_COMMUNITY)
Admission: RE | Admit: 2020-09-30 | Discharge: 2020-09-30 | Disposition: A | Discharge status: Home / Self Care | Payer: PPO | Source: Ambulatory Visit | Attending: Family Medicine | Admitting: Family Medicine

## 2020-09-30 ENCOUNTER — Other Ambulatory Visit: Payer: Self-pay

## 2020-09-30 DIAGNOSIS — I10 Essential (primary) hypertension: Secondary | ICD-10-CM | POA: Diagnosis not present

## 2020-09-30 DIAGNOSIS — I340 Nonrheumatic mitral (valve) insufficiency: Secondary | ICD-10-CM | POA: Diagnosis not present

## 2020-09-30 DIAGNOSIS — R42 Dizziness and giddiness: Secondary | ICD-10-CM | POA: Insufficient documentation

## 2020-09-30 DIAGNOSIS — I421 Obstructive hypertrophic cardiomyopathy: Secondary | ICD-10-CM | POA: Insufficient documentation

## 2020-09-30 LAB — ECHOCARDIOGRAM COMPLETE
Area-P 1/2: 3.91 cm2
S' Lateral: 2.7 cm

## 2020-10-06 ENCOUNTER — Encounter: Payer: Self-pay | Admitting: Internal Medicine

## 2020-10-06 ENCOUNTER — Other Ambulatory Visit: Payer: Self-pay

## 2020-10-06 ENCOUNTER — Other Ambulatory Visit: Payer: Self-pay | Admitting: Internal Medicine

## 2020-10-06 ENCOUNTER — Ambulatory Visit (INDEPENDENT_AMBULATORY_CARE_PROVIDER_SITE_OTHER): Payer: PPO

## 2020-10-06 ENCOUNTER — Ambulatory Visit: Payer: PPO | Admitting: Internal Medicine

## 2020-10-06 VITALS — BP 160/58 | HR 53 | Ht 66.0 in | Wt 165.4 lb

## 2020-10-06 DIAGNOSIS — I421 Obstructive hypertrophic cardiomyopathy: Secondary | ICD-10-CM | POA: Diagnosis not present

## 2020-10-06 DIAGNOSIS — I10 Essential (primary) hypertension: Secondary | ICD-10-CM

## 2020-10-06 DIAGNOSIS — R42 Dizziness and giddiness: Secondary | ICD-10-CM

## 2020-10-06 DIAGNOSIS — Z8241 Family history of sudden cardiac death: Secondary | ICD-10-CM

## 2020-10-06 LAB — CBC
Hematocrit: 38.1 % (ref 37.5–51.0)
Hemoglobin: 13 g/dL (ref 13.0–17.7)
MCH: 31.9 pg (ref 26.6–33.0)
MCHC: 34.1 g/dL (ref 31.5–35.7)
MCV: 93 fL (ref 79–97)
Platelets: 218 10*3/uL (ref 150–450)
RBC: 4.08 x10E6/uL — ABNORMAL LOW (ref 4.14–5.80)
RDW: 13.2 % (ref 11.6–15.4)
WBC: 6.3 10*3/uL (ref 3.4–10.8)

## 2020-10-06 MED ORDER — HYDRALAZINE HCL 10 MG PO TABS: 10.0000 mg | ORAL_TABLET | Freq: Two times a day (BID) | ORAL | 3 refills | Status: DC

## 2020-10-06 NOTE — Patient Instructions (Signed)
Medication Instructions:  Your physician has recommended you make the following change in your medication:  START:  hydralazine 10 mg by mouth twice daily  *If you need a refill on your cardiac medications before your next appointment, please call your pharmacy*   Lab Work: TODAY: CBC If you have labs (blood work) drawn today and your tests are completely normal, you will receive your results only by: [place] (if you have MyChart) OR A paper copy in the mail If you have any lab test that is abnormal or we need to change your treatment, we will call you to review the results.   Testing/Procedures: Your physician has requested that you have a cardiac MRI. Cardiac MRI uses a computer to create images of your heart as its beating, producing both still and moving pictures of your heart and major blood vessels. For further information please visit [email or web address] Please follow the instruction sheet given to you today for more information.   Your physician has requested that you wear a 14 day heart monitor.    Follow-Up: At [facility], you and your health needs are our priority.  As part of our continuing mission to provide you with exceptional heart care, we have created designated Provider Care Teams.  These Care Teams include your primary Cardiologist (physician) and Advanced Practice Providers (APPs -  Physician Assistants and Nurse Practitioners) who all work together to provide you with the care you need, when you need it.   Your next appointment:   6-8 week(s)  The format for your next appointment:   In Person  Provider:   You may see Rudean Haskell, MD or one of the following Advanced Practice Providers on your designated Care Team:   Melina Copa, PA-C Ermalinda Barrios, PA-C   Other Instructions    You are scheduled for Cardiac MRI on ___TBD___________. Please arrive at the [facility] main entrance of [facility] at __________TBD______ (30-45  minutes prior to test start time). ?  [facility] [address] [place],  [address] [phone]  Please take advantage of the free valet parking available at the MAIN entrance (A entrance). Proceed to the [facility] Radiology Department (First Floor). ? Magnetic resonance imaging (MRI) is a painless test that produces images of the inside of the body without using Xrays.  During an MRI, strong magnets and radio waves work together in a Research officer, political party to form detailed images.   MRI images may provide more details about a medical condition than X-rays, CT scans, and ultrasounds can provide.  You may be given earphones to listen for instructions.  You may eat a light breakfast and take medications as ordered with the exception of HCTZ (fluid pill, other). Please avoid stimulants for 12 hr prior to test. (Ie. Caffeine, nicotine, chocolate, or antihistamine medications)  If a contrast material will be used, an IV will be inserted into one of your veins. Contrast material will be injected into your IV. It will leave your body through your urine within a day. You may be told to drink plenty of fluids to help flush the contrast material out of your system.  You will be asked to remove all metal, including: Watch, jewelry, and other metal objects including hearing aids, hair pieces and dentures. Also wearable glucose monitoring systems (ie. Freestyle Libre and Omnipods) (Braces and fillings normally are not a problem.)   TEST WILL TAKE APPROXIMATELY 1 HOUR  PLEASE NOTIFY SCHEDULING AT LEAST 24 HOURS IN ADVANCE  IF YOU ARE UNABLE TO KEEP YOUR APPOINTMENT. [phone]  Please call Marchia Bond, cardiac imaging nurse navigator with any questions/concerns. Marchia Bond RN Navigator Cardiac Imaging Gordy Clement RN Navigator Cardiac Imaging [facility] [phone] Office     ZIO XT- Long Term Monitor Instructions  Your physician has  requested you wear a ZIO patch monitor for 14 days.  This is a single patch monitor. [employer] supplies one patch monitor per enrollment. Additional stickers are not available. Please do not apply patch if you will be having a Nuclear Stress Test,  Echocardiogram, Cardiac CT, MRI, or Chest Xray during the period you would be wearing the  monitor. The patch cannot be worn during these tests. You cannot remove and re-apply the  ZIO XT patch monitor.  Your ZIO patch monitor will be mailed 3 day USPS to your address on file. It may take 3-5 days  to receive your monitor after you have been enrolled.  Once you have received your monitor, please review the enclosed instructions. Your monitor  has already been registered assigning a specific monitor serial # to you.  Billing and Patient Assistance Program Information  We have supplied [employer] with any of your insurance information on file for billing purposes. [employer] offers a sliding scale Patient Assistance Program for patients that do not have  insurance, or whose insurance does not completely cover the cost of the ZIO monitor.  You must apply for the Patient Assistance Program to qualify for this discounted rate.  To apply, please call [employer] at [phone], select option 4, select option 2, ask to apply for  Patient Assistance Program. Theodore Demark will ask your household income, and how many people  are in your household. They will quote your out-of-pocket cost based on that information.  [employer] will also be able to set up a 77-month interest-free payment plan if needed.  Applying the monitor   Shave hair from upper left chest.  Hold abrader disc by orange tab. Rub abrader in 40 strokes over the upper left chest as  indicated in your monitor instructions.  Clean area with 4 enclosed alcohol pads. Let dry.  Apply patch as indicated in monitor instructions. Patch will be placed under collarbone on left  side of chest with arrow pointing  upward.  Rub patch adhesive wings for 2 minutes. Remove white label marked "1". Remove the white  label marked "2". Rub patch adhesive wings for 2 additional minutes.  While looking in a mirror, press and release button in center of patch. A small green light will  flash 3-4 times. This will be your only indicator that the monitor has been turned on.  Do not shower for the first 24 hours. You may shower after the first 24 hours.  Press the button if you feel a symptom. You will hear a small click. Record Date, Time and  Symptom in the Patient Logbook.  When you are ready to remove the patch, follow instructions on the last 2 pages of Patient  Logbook. Stick patch monitor onto the last page of Patient Logbook.  Place Patient Logbook in the blue and white box. Use locking tab on box and tape box closed  securely. The blue and white box has prepaid postage on it. Please place it in the mailbox as  soon as possible. Your physician should have your test results approximately 7 days after the  monitor has [facility]  Call [place]at  [phone] if you have questions regarding  your ZIO XT patch monitor. Call them immediately if you see an orange light blinking on your  monitor.  If your monitor falls off in less than 4 days, contact our Monitor department at [phone].  If your monitor becomes loose or falls off after 4 days call [employer] at [phone] for  suggestions on securing your monitor

## 2020-10-06 NOTE — Progress Notes (Signed)
Cardiology Office Note:    Date:  10/06/2020   ID:  Logan Wu, DOB April 06, 1950, MRN [id number]  PCP:  Lujean Amel, MD   [facility] Providers Cardiologist:  Werner Lean, MD     CC: HTN and HOCM eval Consulted for the evaluation of HOCM at the behest of [place], [place], MD  History of Present Illness:    Logan Wu is a 70 y.o. male with a hx of hypertension who presents for evaluation 10/06/20.  Patient notes that he is feeling better- has had a history of high blood pressure and started a diuretic with his PCP.  After this he had dizziness with standing this resolved with diruetic. As part of his work up had an echocardiogram and was found to have an LVOT gradient.  Post this echo he notes he has nocturnal chest tightness when he thinks about it.  Has had no other chest pain, chest pressure, chest tightness, chest stinging .  Discomfort occurred on diuretics; has no postprandial symptoms and has mild dizziness with sit to stand.  Stays well hydrated.  Active golfer twice a week without symptoms. No shortness of breath, DOE .  No PND or orthopnea.  No bendopnea, weight gain, leg swelling , or abdominal swelling.  No syncope or near syncope off diuretics. Notes  no palpitations or funny heart beats.     Patient reports prior cardiac testing including recent echocardiogram with evidence of SAM and severe LVOT Obstruction.  Ambulatory BP 150/52.   Past Medical History:  Diagnosis Date   Hypertension    Prostate cancer ([place])    Wears contact lenses     Past Surgical History:  Procedure Laterality Date   COLONOSCOPY     disk fusion  1992   cervical   FLEXOR TENDON REPAIR Right 01/24/2014   Procedure: RIGHT HAND,LONG,RING,SMALL FINGERS EXPLORATION WITH REPAIR  OF FLEXOR TENDON  SMALL FINGER;  Surgeon: Leanora Cover, MD;  Location: [place];  Service: Orthopedics;  Laterality: Right;   PROSTATE BIOPSY      Current Medications: Current Meds   Medication Sig   atorvastatin (LIPITOR) 10 MG tablet Take 10 mg by mouth daily.   cholecalciferol (VITAMIN D) 1000 UNITS tablet Take 1,000 Units by mouth daily.   hydrALAZINE (APRESOLINE) 10 MG tablet Take 1 tablet (10 mg total) by mouth 2 (two) times daily.   lisinopril (PRINIVIL,ZESTRIL) 40 MG tablet Take 40 mg by mouth daily.   mometasone (ELOCON) 0.1 % ointment Apply topically as needed.   Multiple Vitamins-Minerals (MULTIVITAMIN WITH MINERALS) tablet Take 1 tablet by mouth daily.   verapamil (VERELAN PM) 180 MG 24 hr capsule Take 180 mg by mouth in the morning and at bedtime.   [DISCONTINUED] atorvastatin (LIPITOR) 20 MG tablet Take 20 mg by mouth daily.   [DISCONTINUED] doxycycline (VIBRA-TABS) 100 MG tablet Take 100 mg by mouth 2 (two) times daily.   [DISCONTINUED] verapamil (VERELAN PM) 240 MG 24 hr capsule Take 240 mg by mouth daily.     Allergies:   Penicillins   Social History   Socioeconomic History   Marital status: Married    Spouse name: Not on file   Number of children: 3   Years of education: Not on file   Highest education level: Not on file  Occupational History    Comment: retired  Tobacco Use   Smoking status: Never   Smokeless tobacco: Never  Vaping Use   Vaping Use: Never used  Substance and  Sexual Activity   Alcohol use: Yes    Alcohol/week: 1.0 standard drink    Types: 1 Cans of beer per week   Drug use: No   Sexual activity: Yes  Other Topics Concern   Not on file  Social History Narrative   Not on file   Social Determinants of Health   Financial Resource Strain: Not on file  Food Insecurity: Not on file  Transportation Needs: Not on file  Physical Activity: Not on file  Stress: Not on file  Social Connections: Not on file   Social:  [name], married, has two brothers occasional beer after goal  Family History: The patient's family history includes Prostate cancer in his brother. There is no history of Breast cancer, Colon cancer,  or Pancreatic cancer. M GF died suddenly and collapsed at age 8, unclear etiology F GF died suddenly.  ROS:   Please see the history of present illness.     All other systems reviewed and are negative.  EKGs/Labs/Other Studies Reviewed:    The following studies were reviewed today:  EKG:  EKG is  ordered today.  The ekg ordered today demonstrates  10/06/20: Sinus Bradycardia rate 53 LVH   Transthoracic Echocardiogram: Date: 09/30/20 Results: Moderate BSH; LVOTO Peak Gradient 73 mm Hg LVEF preserved  1. There is hypertrophic obstructive cardiomyopathy, peak velocity 6.5  m/s with peak gradient of 168 mmHg during Valsalva, SAM (systolic anterior  motion of the mitral valve), thickened septum. Left ventricular ejection  fraction, by estimation, is 65 to  70%. The left ventricle has normal function. The left ventricle has no  regional wall motion abnormalities. There is moderate asymmetric left  ventricular hypertrophy of the basal-septal segment. Left ventricular  diastolic parameters are consistent with  Grade I diastolic dysfunction (impaired relaxation).   2. Right ventricular systolic function is normal. The right ventricular  size is normal.   3. There is systolic anterior motion of the mitral valve. The mitral  valve is normal in structure. Moderate mitral valve regurgitation. No  evidence of mitral stenosis.   4. The aortic valve is normal in structure. Aortic valve regurgitation is  trivial. No aortic stenosis is present.   5. The inferior vena cava is normal in size with greater than 50%  respiratory variability, suggesting right atrial pressure of 3 mmHg.   Conclusion(s)/Recommendation(s): Findings consistent with hypertrophic  obstructive cardiomyopathy.   Recent Labs: No results found for requested labs within last 8760 hours.  Recent Lipid Panel No results found for: CHOL, TRIG, HDL, CHOLHDL, VLDL, LDLCALC, LDLDIRECT   Physical Exam:    VS:  BP (!) 160/58    Pulse (!) 53   Ht '5\' 6"'$  (1.676 m)   Wt 165 lb 6.4 oz (75 kg)   SpO2 99%   BMI 26.70 kg/m     Wt Readings from Last 3 Encounters:  10/06/20 165 lb 6.4 oz (75 kg)  07/12/19 154 lb 8 oz (70.1 kg)  01/24/14 158 lb 2 oz (71.7 kg)     GEN:  Well nourished, well developed in no acute distress HEENT: Normal NECK: No JVD LYMPHATICS: No lymphadenopathy CARDIAC: RRR, no rubs, gallops; 2/6 mid systolic murmmur; 3+ radial pulses RESPIRATORY:  Clear to auscultation without rales, wheezing or rhonchi  ABDOMEN: Soft, non-tender, non-distended MUSCULOSKELETAL:  No edema; No deformity  SKIN: Warm and dry NEUROLOGIC:  Alert and oriented x 3 PSYCHIATRIC:  Normal affect   ASSESSMENT:    1. HOCM (hypertrophic obstructive cardiomyopathy) ([place])  2. Hypertension, unspecified type   3. Family history of sudden cardiac death (SCD)    PLAN:    In order of problems listed above:  Suspected Hypertrophic Cardiomyopathy; with obstruction vs  Hypertensive Heart disease - Sigmoid Septal Variant vs BSH; peak gradient 73 mm Hg- with late peaking  MR, No Apical Aneurysm - Family history SCD, discussed 1st degree family history screening - Septal wall thickness of 1.4 cm - Exercise testing presently deferred - Echo from 7/22 notable for LVOTO - CMR not done- will order for assistance with diagnosis - absence of atrial fibrillation -  2 year assessment for VT on rhythm monitor not done; 2 week Zio ordered - continue verapamil 180 mg PO BID (max tolerated AV nodal agent) - given sx with diuretic will trial low dose vasodilator and monitor closely for sx (and continue amb BP monitoring)  Six to eight weeks follow up unless new symptoms or abnormal test results warranting change in plan    Medication Adjustments/Labs and Tests Ordered: Current medicines are reviewed at length with the patient today.  Concerns regarding medicines are outlined above.  Orders Placed This Encounter  Procedures   MR  CARDIAC MORPHOLOGY W WO CONTRAST   CBC   EKG 12-Lead   Meds ordered this encounter  Medications   hydrALAZINE (APRESOLINE) 10 MG tablet    Sig: Take 1 tablet (10 mg total) by mouth 2 (two) times daily.    Dispense:  180 tablet    Refill:  3    Patient Instructions  Medication Instructions:  Your physician has recommended you make the following change in your medication:  START:  hydralazine 10 mg by mouth twice daily  *If you need a refill on your cardiac medications before your next appointment, please call your pharmacy*   Lab Work: TODAY: CBC If you have labs (blood work) drawn today and your tests are completely normal, you will receive your results only by: [place] (if you have MyChart) OR A paper copy in the mail If you have any lab test that is abnormal or we need to change your treatment, we will call you to review the results.   Testing/Procedures: Your physician has requested that you have a cardiac MRI. Cardiac MRI uses a computer to create images of your heart as its beating, producing both still and moving pictures of your heart and major blood vessels. For further information please visit [email or web address] Please follow the instruction sheet given to you today for more information.   Your physician has requested that you wear a 14 day heart monitor.    Follow-Up: At [facility], you and your health needs are our priority.  As part of our continuing mission to provide you with exceptional heart care, we have created designated Provider Care Teams.  These Care Teams include your primary Cardiologist (physician) and Advanced Practice Providers (APPs -  Physician Assistants and Nurse Practitioners) who all work together to provide you with the care you need, when you need it.   Your next appointment:   6-8 week(s)  The format for your next appointment:   In Person  Provider:   You may see Rudean Haskell, MD or one of the following Advanced  Practice Providers on your designated Care Team:   Melina Copa, PA-C Ermalinda Barrios, PA-C   Other Instructions    You are scheduled for Cardiac MRI on ___TBD___________. Please arrive at the [facility] main entrance of [facility] at __________TBD______ (30-45 minutes  prior to test start time). ?  [facility] [address] [place], [place] [address] [phone]  Please take advantage of the free valet parking available at the MAIN entrance (A entrance). Proceed to the [facility] Radiology Department (First Floor). ? Magnetic resonance imaging (MRI) is a painless test that produces images of the inside of the body without using Xrays.  During an MRI, strong magnets and radio waves work together in a Research officer, political party to form detailed images.   MRI images may provide more details about a medical condition than X-rays, CT scans, and ultrasounds can provide.  You may be given earphones to listen for instructions.  You may eat a light breakfast and take medications as ordered with the exception of HCTZ (fluid pill, other). Please avoid stimulants for 12 hr prior to test. (Ie. Caffeine, nicotine, chocolate, or antihistamine medications)  If a contrast material will be used, an IV will be inserted into one of your veins. Contrast material will be injected into your IV. It will leave your body through your urine within a day. You may be told to drink plenty of fluids to help flush the contrast material out of your system.  You will be asked to remove all metal, including: Watch, jewelry, and other metal objects including hearing aids, hair pieces and dentures. Also wearable glucose monitoring systems (ie. Freestyle Libre and Omnipods) (Braces and fillings normally are not a problem.)   TEST WILL TAKE APPROXIMATELY 1 HOUR  PLEASE NOTIFY SCHEDULING AT LEAST 24 HOURS IN ADVANCE IF YOU ARE UNABLE TO KEEP YOUR APPOINTMENT. [phone]  Please call Marchia Bond, cardiac  imaging nurse navigator with any questions/concerns. Marchia Bond RN Navigator Cardiac Imaging Gordy Clement RN Navigator Cardiac Imaging [facility] [phone] Office     ZIO XT- Long Term Monitor Instructions  Your physician has requested you wear a ZIO patch monitor for 14 days.  This is a single patch monitor. [employer] supplies one patch monitor per enrollment. Additional stickers are not available. Please do not apply patch if you will be having a Nuclear Stress Test,  Echocardiogram, Cardiac CT, MRI, or Chest Xray during the period you would be wearing the  monitor. The patch cannot be worn during these tests. You cannot remove and re-apply the  ZIO XT patch monitor.  Your ZIO patch monitor will be mailed 3 day USPS to your address on file. It may take 3-5 days  to receive your monitor after you have been enrolled.  Once you have received your monitor, please review the enclosed instructions. Your monitor  has already been registered assigning a specific monitor serial # to you.  Billing and Patient Assistance Program Information  We have supplied [employer] with any of your insurance information on file for billing purposes. [employer] offers a sliding scale Patient Assistance Program for patients that do not have  insurance, or whose insurance does not completely cover the cost of the ZIO monitor.  You must apply for the Patient Assistance Program to qualify for this discounted rate.  To apply, please call [employer] at [phone], select option 4, select option 2, ask to apply for  Patient Assistance Program. Theodore Demark will ask your household income, and how many people  are in your household. They will quote your out-of-pocket cost based on that information.  [employer] will also be able to set up a 63-month interest-free payment plan if needed.  Applying the monitor   Shave hair from upper left  chest.  Hold abrader disc by orange tab. Rub abrader in 40  strokes over the upper left chest as  indicated in your monitor instructions.  Clean area with 4 enclosed alcohol pads. Let dry.  Apply patch as indicated in monitor instructions. Patch will be placed under collarbone on left  side of chest with arrow pointing upward.  Rub patch adhesive wings for 2 minutes. Remove white label marked "1". Remove the white  label marked "2". Rub patch adhesive wings for 2 additional minutes.  While looking in a mirror, press and release button in center of patch. A small green light will  flash 3-4 times. This will be your only indicator that the monitor has been turned on.  Do not shower for the first 24 hours. You may shower after the first 24 hours.  Press the button if you feel a symptom. You will hear a small click. Record Date, Time and  Symptom in the Patient Logbook.  When you are ready to remove the patch, follow instructions on the last 2 pages of Patient  Logbook. Stick patch monitor onto the last page of Patient Logbook.  Place Patient Logbook in the blue and white box. Use locking tab on box and tape box closed  securely. The blue and white box has prepaid postage on it. Please place it in the mailbox as  soon as possible. Your physician should have your test results approximately 7 days after the  monitor has been mailed back to [facility].  Call [place] at [phone] if you have questions regarding  your ZIO XT patch monitor. Call them immediately if you see an orange light blinking on your  monitor.  If your monitor falls off in less than 4 days, contact our Monitor department at [phone].  If your monitor becomes loose or falls off after 4 days call [employer] at [phone] for  suggestions on securing your monitor     Signed, Werner Lean, MD  10/06/2020 9:11 AM    [place]

## 2020-10-06 NOTE — Progress Notes (Unsigned)
Patient enrolled for [employer] to mail a 14 day ZIO XT monitor to address on file. 

## 2020-10-10 DIAGNOSIS — R42 Dizziness and giddiness: Secondary | ICD-10-CM

## 2020-10-10 DIAGNOSIS — I421 Obstructive hypertrophic cardiomyopathy: Secondary | ICD-10-CM | POA: Diagnosis not present

## 2020-10-10 DIAGNOSIS — I10 Essential (primary) hypertension: Secondary | ICD-10-CM

## 2020-10-29 DIAGNOSIS — I421 Obstructive hypertrophic cardiomyopathy: Secondary | ICD-10-CM | POA: Diagnosis not present

## 2020-10-29 DIAGNOSIS — I10 Essential (primary) hypertension: Secondary | ICD-10-CM | POA: Diagnosis not present

## 2020-10-29 DIAGNOSIS — R42 Dizziness and giddiness: Secondary | ICD-10-CM | POA: Diagnosis not present

## 2020-11-11 ENCOUNTER — Telehealth (HOSPITAL_COMMUNITY): Payer: Self-pay | Admitting: *Deleted

## 2020-11-11 NOTE — Telephone Encounter (Signed)
Reaching out to patient to offer assistance regarding upcoming cardiac imaging study; pt verbalizes understanding of appt date/time, parking situation and where to check in, and verified current allergies; name and call back number provided for further questions should they arise  Ebonie Westerlund RN Navigator Cardiac Imaging [facility] [phone] office [phone] cell  

## 2020-11-13 ENCOUNTER — Ambulatory Visit (HOSPITAL_COMMUNITY)
Admission: RE | Admit: 2020-11-13 | Discharge: 2020-11-13 | Disposition: A | Discharge status: Home / Self Care | Payer: PPO | Source: Ambulatory Visit | Attending: Internal Medicine | Admitting: Internal Medicine

## 2020-11-13 ENCOUNTER — Other Ambulatory Visit: Payer: Self-pay

## 2020-11-13 DIAGNOSIS — I10 Essential (primary) hypertension: Secondary | ICD-10-CM | POA: Insufficient documentation

## 2020-11-13 DIAGNOSIS — I421 Obstructive hypertrophic cardiomyopathy: Secondary | ICD-10-CM | POA: Insufficient documentation

## 2020-11-13 MED ORDER — GADOBUTROL 1 MMOL/ML IV SOLN
8.0000 mL | Freq: Once | INTRAVENOUS | Status: AC | PRN
  Administered 2020-11-13: 8 mL via INTRAVENOUS

## 2020-11-23 NOTE — Progress Notes (Signed)
Cardiology Office Note:    Date:  11/24/2020   ID:  Logan Wu, DOB November 29, 1950, MRN [id number]  PCP:  Logan Amel, MD   [facility] Providers Cardiologist:  Logan Lean, MD     CC: HOCM f/u  History of Present Illness:    Logan Wu is a 70 y.o. male with a hx of hypertension and HOCM who presents for evaluation 10/06/20.  In interim of this visit, patient has CMR without scar and normal heart monitor.  Seen 11/24/20.  Patient notes that he is doing better.  Since last visit notes he is able to do 10,000 steps a day changes.  Relevant interval testing or therapy include CMR without issue.  There are no interval hospital/ED visit.    No chest pain or pressure .  No SOB/DOE and no PND/Orthopnea.  No weight gain or leg swelling.  No palpitations or syncope.  Of diuretic no near syncope or syncope  Ambulatory blood pressure 130/60s.   Past Medical History:  Diagnosis Date   Hypertension    Prostate cancer ([place])    Wears contact lenses     Past Surgical History:  Procedure Laterality Date   COLONOSCOPY     disk fusion  1992   cervical   FLEXOR TENDON REPAIR Right 01/24/2014   Procedure: RIGHT HAND,LONG,RING,SMALL FINGERS EXPLORATION WITH REPAIR  OF FLEXOR TENDON  SMALL FINGER;  Surgeon: Leanora Cover, MD;  Location: [place];  Service: Orthopedics;  Laterality: Right;   PROSTATE BIOPSY      Current Medications: Current Meds  Medication Sig   atorvastatin (LIPITOR) 10 MG tablet Take 10 mg by mouth daily.   cholecalciferol (VITAMIN D) 1000 UNITS tablet Take 1,000 Units by mouth daily.   hydrALAZINE (APRESOLINE) 10 MG tablet Take 1 tablet (10 mg total) by mouth 2 (two) times daily.   lisinopril (PRINIVIL,ZESTRIL) 40 MG tablet Take 40 mg by mouth daily.   mometasone (ELOCON) 0.1 % ointment Apply topically as needed.   Multiple Vitamins-Minerals (MULTIVITAMIN WITH MINERALS) tablet Take 1 tablet by mouth daily.   verapamil (VERELAN PM) 180 MG  24 hr capsule Take 180 mg by mouth in the morning and at bedtime.     Allergies:   Penicillins   Social History   Socioeconomic History   Marital status: Married    Spouse name: Not on file   Number of children: 3   Years of education: Not on file   Highest education level: Not on file  Occupational History    Comment: retired  Tobacco Use   Smoking status: Never   Smokeless tobacco: Never  Vaping Use   Vaping Use: Never used  Substance and Sexual Activity   Alcohol use: Yes    Alcohol/week: 1.0 standard drink    Types: 1 Cans of beer per week   Drug use: No   Sexual activity: Yes  Other Topics Concern   Not on file  Social History Narrative   Not on file   Social Determinants of Health   Financial Resource Strain: Not on file  Food Insecurity: Not on file  Transportation Needs: Not on file  Physical Activity: Not on file  Stress: Not on file  Social Connections: Not on file   Social:  [name], married, 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 There is no history of Breast cancer, Colon cancer, or  Pancreatic cancer. M GF died suddenly and collapsed at age 23, unclear etiology F GF died suddenly.  ROS:   Please see the history of present illness.     All other systems reviewed and are negative.  EKGs/Labs/Other Studies Reviewed:    The following studies were reviewed today:  EKG:  EKG is  ordered today.  The ekg ordered today demonstrates  10/06/20: Sinus Bradycardia rate 53 LVH  Transthoracic Echocardiogram: Date: 09/30/20 Results: Moderate BSH; LVOTO Peak Gradient 73 mm Hg LVEF preserved  1. There is hypertrophic obstructive cardiomyopathy, peak velocity 6.5  m/s with peak gradient of 168 mmHg during Valsalva, SAM (systolic anterior  motion of the mitral valve), thickened septum. Left ventricular ejection  fraction, by  estimation, is 65 to  70%. The left ventricle has normal function. The left ventricle has no  regional wall motion abnormalities. There is moderate asymmetric left  ventricular hypertrophy of the basal-septal segment. Left ventricular  diastolic parameters are consistent with  Grade I diastolic dysfunction (impaired relaxation).   2. Right ventricular systolic function is normal. The right ventricular  size is normal.   3. There is systolic anterior motion of the mitral valve. The mitral  valve is normal in structure. Moderate mitral valve regurgitation. No  evidence of mitral stenosis.   4. The aortic valve is normal in structure. Aortic valve regurgitation is  trivial. No aortic stenosis is present.   5. The inferior vena cava is normal in size with greater than 50%  respiratory variability, suggesting right atrial pressure of 3 mmHg.   Conclusion(s)/Recommendation(s): Findings consistent with hypertrophic  obstructive cardiomyopathy.   Cardiac Event Monitoring: Date: 10/29/20 Results: Patient had a minimum heart rate of 35 bpm, maximum heart rate of 108 bpm, and average heart rate of 56 bpm. Predominant underlying rhythm was sinus bradycardia. Isolated PACs were rare (<1.0%). Isolated PVCs were rare (<1.0%). 1st degree heart block was present. No triggered and diary events.   No malignant arrhythmias.  CMR: Date: 11/13/20 Results: IMPRESSION: Wall thickness greater than 15 mm without MRI findings suggestive of infiltrative disease.   Study meets imaging criteria for hypertrophic cardiomyopathy.   Recent Labs: 10/06/2020: Hemoglobin 13.0; Platelets 218  Recent Lipid Panel No results found for: CHOL, TRIG, HDL, CHOLHDL, VLDL, LDLCALC, LDLDIRECT   Physical Exam:    VS:  BP (!) 142/80   Pulse 74   Ht '5\' 6"'$  (1.676 m)   Wt 163 lb 3.2 oz (74 kg)   SpO2 98%   BMI 26.34 kg/m     Wt Readings from Last 3 Encounters:  11/24/20 163 lb 3.2 oz (74 kg)  10/06/20 165 lb 6.4  oz (75 kg)  07/12/19 154 lb 8 oz (70.1 kg)     GEN:  Well nourished, well developed in no acute distress HEENT: Normal NECK: No JVD LYMPHATICS: No lymphadenopathy CARDIAC: RRR, no rubs, gallops; 2/6 mid systolic murmmur worse with standing RESPIRATORY:  Clear to auscultation without rales, wheezing or rhonchi  ABDOMEN: Soft, non-tender, non-distended MUSCULOSKELETAL:  No edema; No deformity  SKIN: Warm and dry NEUROLOGIC:  Alert and oriented x 3 PSYCHIATRIC:  Normal affect   ASSESSMENT:    1. HOCM (hypertrophic obstructive cardiomyopathy) ([place])   2. Essential hypertension     PLAN:    In order of problems listed above:  Hypertrophic Cardiomyopathy; with obstruction vs Hypertensive Heart disease with hypertrophic phenocopy - Sigmoid Septal Variant vs BSH; peak gradient 73 mm Hg- with late peaking MR, No Apical Aneurysm -  Family history SCD, discussed 1st degree family history screening at initial visit - Septal wall thickness of 15 mm - will order stress echo for assessment of LVOTO and ectopy - Echo from 7/22 notable for LVOTO - CMR without scar - absence of atrial fibrillation -  2 year assessment for VT on rhythm monitor done 10/29/20 without ectopy - continue verapamil 180 mg PO BID but may be able to tolerate higher dose  - BP controlled on Apresoline and Zestril if elevated BP and CCB at max would start MRA  Will plan for 3 month follow up unless new symptoms or abnormal test results warranting change in plan     Medication Adjustments/Labs and Tests Ordered: Current medicines are reviewed at length with the patient today.  Concerns regarding medicines are outlined above.  Orders Placed This Encounter  Procedures   Cardiac Stress Test: Informed Consent Details: Physician/Practitioner Attestation; Transcribe to consent form and obtain patient signature   ECHOCARDIOGRAM STRESS TEST    No orders of the defined types were placed in this encounter.   Patient  Instructions  Medication Instructions:  Your physician recommends that you continue on your current medications as directed. Please refer to the Current Medication list given to you today.  *If you need a refill on your cardiac medications before your next appointment, please call your pharmacy*   Lab Work: NONE If you have labs (blood work) drawn today and your tests are completely normal, you will receive your results only by: [place] (if you have MyChart) OR A paper copy in the mail If you have any lab test that is abnormal or we need to change your treatment, we will call you to review the results.   Testing/Procedures: Your physician has requested that you have a stress echocardiogram. For further information please visit [email or web address]. Please follow instruction sheet as given.    Follow-Up: At [facility], you and your health needs are our priority.  As part of our continuing mission to provide you with exceptional heart care, we have created designated Provider Care Teams.  These Care Teams include your primary Cardiologist (physician) and Advanced Practice Providers (APPs -  Physician Assistants and Nurse Practitioners) who all work together to provide you with the care you need, when you need it.     Your next appointment:   3 month(s)  The format for your next appointment:   In Person  Provider:   You may see Logan Lean, MD or one of the following Advanced Practice Providers on your designated Care Team:   Melina Copa, PA-C Ermalinda Barrios, PA-C        Signed, Logan Lean, MD  11/24/2020 10:34 AM    [place]

## 2020-11-24 ENCOUNTER — Other Ambulatory Visit: Payer: Self-pay

## 2020-11-24 ENCOUNTER — Ambulatory Visit: Payer: PPO | Admitting: Internal Medicine

## 2020-11-24 ENCOUNTER — Encounter: Payer: Self-pay | Admitting: Internal Medicine

## 2020-11-24 VITALS — BP 142/80 | HR 74 | Ht 66.0 in | Wt 163.2 lb

## 2020-11-24 DIAGNOSIS — I10 Essential (primary) hypertension: Secondary | ICD-10-CM

## 2020-11-24 DIAGNOSIS — I421 Obstructive hypertrophic cardiomyopathy: Secondary | ICD-10-CM | POA: Diagnosis not present

## 2020-11-24 NOTE — Patient Instructions (Signed)
Medication Instructions:  Your physician recommends that you continue on your current medications as directed. Please refer to the Current Medication list given to you today.  *If you need a refill on your cardiac medications before your next appointment, please call your pharmacy*   Lab Work: NONE If you have labs (blood work) drawn today and your tests are completely normal, you will receive your results only by: [place] (if you have MyChart) OR A paper copy in the mail If you have any lab test that is abnormal or we need to change your treatment, we will call you to review the results.   Testing/Procedures: Your physician has requested that you have a stress echocardiogram. For further information please visit [email or web address]. Please follow instruction sheet as given.    Follow-Up: At [facility], you and your health needs are our priority.  As part of our continuing mission to provide you with exceptional heart care, we have created designated Provider Care Teams.  These Care Teams include your primary Cardiologist (physician) and Advanced Practice Providers (APPs -  Physician Assistants and Nurse Practitioners) who all work together to provide you with the care you need, when you need it.     Your next appointment:   3 month(s)  The format for your next appointment:   In Person  Provider:   You may see Werner Lean, MD or one of the following Advanced Practice Providers on your designated Care Team:   Melina Copa, PA-C Ermalinda Barrios, PA-C

## 2020-12-08 DIAGNOSIS — Z23 Encounter for immunization: Secondary | ICD-10-CM | POA: Diagnosis not present

## 2020-12-14 ENCOUNTER — Telehealth (HOSPITAL_COMMUNITY): Payer: Self-pay

## 2020-12-14 NOTE — Telephone Encounter (Signed)
Attempted to call the patient with instructions for his test. N/A and no voicemail. Will try again later. S.Jaizon Deroos EMTP

## 2020-12-15 ENCOUNTER — Ambulatory Visit (HOSPITAL_COMMUNITY): Payer: PPO | Attending: Cardiology

## 2020-12-15 ENCOUNTER — Other Ambulatory Visit: Payer: Self-pay

## 2020-12-15 ENCOUNTER — Ambulatory Visit (HOSPITAL_COMMUNITY): Payer: PPO

## 2020-12-15 DIAGNOSIS — I421 Obstructive hypertrophic cardiomyopathy: Secondary | ICD-10-CM | POA: Insufficient documentation

## 2020-12-15 LAB — ECHOCARDIOGRAM STRESS TEST
Area-P 1/2: 3.94 cm2
MV M vel: 5.62 m/s
MV Peak grad: 126.3 mmHg

## 2020-12-16 DIAGNOSIS — M542 Cervicalgia: Secondary | ICD-10-CM | POA: Diagnosis not present

## 2020-12-23 DIAGNOSIS — S134XXD Sprain of ligaments of cervical spine, subsequent encounter: Secondary | ICD-10-CM | POA: Diagnosis not present

## 2020-12-28 ENCOUNTER — Telehealth: Payer: Self-pay

## 2020-12-28 DIAGNOSIS — I10 Essential (primary) hypertension: Secondary | ICD-10-CM

## 2020-12-28 MED ORDER — SPIRONOLACTONE 25 MG PO TABS: 12.5000 mg | ORAL_TABLET | Freq: Every day | ORAL | 3 refills | Status: DC

## 2020-12-28 NOTE — Telephone Encounter (Signed)
Called pt to review BP readings.  He reports SBP is in the 150's and DBM high 50's -60's.  I advised him to start aldactone 12.5 mg PO QD and come in for lab work in 7-10 days.  I also advised him to continue to check BP daily d/t starting a new medication.  He is agreeable to plan of care orders placed.

## 2020-12-28 NOTE — Telephone Encounter (Signed)
-----   Message from Werner Lean, MD sent at 12/17/2020  9:49 AM EDT ----- Results: Mild PVCs with exercise and recovery Hypertensive Response No significant inducible gradient Plan: If BP remains elevated on ambulatory BP monitoring, start aldactone 12.5 mg PO Daily and BMP in 7-10 days  Werner Lean, MD

## 2020-12-31 DIAGNOSIS — S134XXD Sprain of ligaments of cervical spine, subsequent encounter: Secondary | ICD-10-CM | POA: Diagnosis not present

## 2021-01-06 ENCOUNTER — Other Ambulatory Visit: Payer: Self-pay

## 2021-01-06 ENCOUNTER — Other Ambulatory Visit: Payer: PPO | Admitting: *Deleted

## 2021-01-06 DIAGNOSIS — I10 Essential (primary) hypertension: Secondary | ICD-10-CM

## 2021-01-06 LAB — BASIC METABOLIC PANEL
BUN/Creatinine Ratio: 16 (ref 10–24)
BUN: 18 mg/dL (ref 8–27)
CO2: 21 mmol/L (ref 20–29)
Calcium: 9.7 mg/dL (ref 8.6–10.2)
Chloride: 103 mmol/L (ref 96–106)
Creatinine, Ser: 1.11 mg/dL (ref 0.76–1.27)
Glucose: 88 mg/dL (ref 70–99)
Potassium: 4.5 mmol/L (ref 3.5–5.2)
Sodium: 139 mmol/L (ref 134–144)
eGFR: 71 mL/min/{1.73_m2} (ref >59)

## 2021-01-07 DIAGNOSIS — I10 Essential (primary) hypertension: Secondary | ICD-10-CM

## 2021-01-07 MED ORDER — SPIRONOLACTONE 25 MG PO TABS: 25.0000 mg | ORAL_TABLET | Freq: Every day | ORAL | 3 refills | Status: DC

## 2021-01-08 DIAGNOSIS — S134XXD Sprain of ligaments of cervical spine, subsequent encounter: Secondary | ICD-10-CM | POA: Diagnosis not present

## 2021-01-15 DIAGNOSIS — M542 Cervicalgia: Secondary | ICD-10-CM | POA: Diagnosis not present

## 2021-01-15 DIAGNOSIS — S134XXD Sprain of ligaments of cervical spine, subsequent encounter: Secondary | ICD-10-CM | POA: Diagnosis not present

## 2021-01-20 ENCOUNTER — Other Ambulatory Visit: Payer: Self-pay

## 2021-01-20 ENCOUNTER — Other Ambulatory Visit: Payer: PPO

## 2021-01-20 DIAGNOSIS — I10 Essential (primary) hypertension: Secondary | ICD-10-CM

## 2021-01-20 LAB — BASIC METABOLIC PANEL
BUN/Creatinine Ratio: 16 (ref 10–24)
BUN: 19 mg/dL (ref 8–27)
CO2: 24 mmol/L (ref 20–29)
Calcium: 9.6 mg/dL (ref 8.6–10.2)
Chloride: 100 mmol/L (ref 96–106)
Creatinine, Ser: 1.19 mg/dL (ref 0.76–1.27)
Glucose: 121 mg/dL — ABNORMAL HIGH (ref 70–99)
Potassium: 4.3 mmol/L (ref 3.5–5.2)
Sodium: 138 mmol/L (ref 134–144)
eGFR: 66 mL/min/{1.73_m2} (ref >59)

## 2021-01-29 ENCOUNTER — Encounter: Payer: Self-pay | Admitting: Internal Medicine

## 2021-02-09 ENCOUNTER — Encounter: Payer: Self-pay | Admitting: Internal Medicine

## 2021-02-15 DIAGNOSIS — M542 Cervicalgia: Secondary | ICD-10-CM | POA: Diagnosis not present

## 2021-02-22 DIAGNOSIS — M542 Cervicalgia: Secondary | ICD-10-CM | POA: Diagnosis not present

## 2021-02-22 NOTE — Progress Notes (Signed)
Cardiology Office Note:    Date:  02/23/2021   ID:  Logan Wu, DOB Jan 24, 1951, MRN [id number]  PCP:  Lujean Amel, MD   [facility] Providers Cardiologist:  Werner Lean, MD     CC: HCM f/u  History of Present Illness:    MCIHAEL Wu is a 70 y.o. male with a hx of hypertension and HOCM who presents for evaluation 10/06/20.  In interim of this visit, patient has CMR without scar and normal heart monitor.  Seen 11/24/20.  In interval we had added and increased spironolactone  Seen 02/23/21.  Patient notes that he is doing well- having some spinal issues but is seeing orthopedics.   There are no interval hospital/ED visit.    No chest pain or pressure.  No SOB/DOE and no PND/Orthopnea.  No weight gain or leg swelling.  No palpitations or syncope.  Ambulatory blood pressure 120-130.   Past Medical History:  Diagnosis Date   Hypertension    Prostate cancer ([place])    Wears contact lenses     Past Surgical History:  Procedure Laterality Date   COLONOSCOPY     disk fusion  1992   cervical   FLEXOR TENDON REPAIR Right 01/24/2014   Procedure: RIGHT HAND,LONG,RING,SMALL FINGERS EXPLORATION WITH REPAIR  OF FLEXOR TENDON  SMALL FINGER;  Surgeon: Leanora Cover, MD;  Location: [facility];  Service: Orthopedics;  Laterality: Right;   PROSTATE BIOPSY      Current Medications: Current Meds  Medication Sig   atorvastatin (LIPITOR) 10 MG tablet Take 10 mg by mouth daily.   cholecalciferol (VITAMIN D) 1000 UNITS tablet Take 1,000 Units by mouth daily.   gabapentin (NEURONTIN) 300 MG capsule Take 300 mg by mouth at bedtime.   hydrALAZINE (APRESOLINE) 10 MG tablet Take 1 tablet (10 mg total) by mouth 2 (two) times daily.   lisinopril (PRINIVIL,ZESTRIL) 40 MG tablet Take 40 mg by mouth daily.   mometasone (ELOCON) 0.1 % ointment Apply topically as needed.   Multiple Vitamins-Minerals (MULTIVITAMIN WITH MINERALS) tablet Take 1 tablet by mouth daily.    spironolactone (ALDACTONE) 25 MG tablet Take 1 tablet (25 mg total) by mouth daily.   verapamil (VERELAN PM) 180 MG 24 hr capsule Take 180 mg by mouth in the morning and at bedtime.     Allergies:   Penicillins   Social History   Socioeconomic History   Marital status: Married    Spouse name: Not on file   Number of children: 3   Years of education: Not on file   Highest education level: Not on file  Occupational History    Comment: retired  Tobacco Use   Smoking status: Never   Smokeless tobacco: Never  Vaping Use   Vaping Use: Never used  Substance and Sexual Activity   Alcohol use: Yes    Alcohol/week: 1.0 standard drink    Types: 1 Cans of beer per week   Drug use: No   Sexual activity: Yes  Other Topics Concern   Not on file  Social History Narrative   Not on file   Social Determinants of Health   Financial Resource Strain: Not on file  Food Insecurity: Not on file  Transportation Needs: Not on file  Physical Activity: Not on file  Stress: Not on file  Social Connections: Not on file   Social:  Logan Wu, married, 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 There is no history of Breast cancer, Colon cancer, or Pancreatic cancer. M GF died suddenly and collapsed at age 61, unclear etiology F GF died suddenly.  ROS:   Please see the history of present illness.     All other systems reviewed and are negative.  EKGs/Labs/Other Studies Reviewed:    The following studies were reviewed today:  EKG:   10/06/20: Sinus Bradycardia rate 53 LVH  Transthoracic Echocardiogram: Date: 09/30/20 Results: Moderate BSH; LVOTO Peak Gradient 73 mm Hg LVEF preserved  1. There is hypertrophic obstructive cardiomyopathy, peak velocity 6.5  m/s with peak gradient of 168 mmHg during Valsalva, SAM (systolic anterior  motion of the mitral valve),  thickened septum. Left ventricular ejection  fraction, by estimation, is 65 to  70%. The left ventricle has normal function. The left ventricle has no  regional wall motion abnormalities. There is moderate asymmetric left  ventricular hypertrophy of the basal-septal segment. Left ventricular  diastolic parameters are consistent with  Grade I diastolic dysfunction (impaired relaxation).   2. Right ventricular systolic function is normal. The right ventricular  size is normal.   3. There is systolic anterior motion of the mitral valve. The mitral  valve is normal in structure. Moderate mitral valve regurgitation. No  evidence of mitral stenosis.   4. The aortic valve is normal in structure. Aortic valve regurgitation is  trivial. No aortic stenosis is present.   5. The inferior vena cava is normal in size with greater than 50%  respiratory variability, suggesting right atrial pressure of 3 mmHg.   Conclusion(s)/Recommendation(s): Findings consistent with hypertrophic  obstructive cardiomyopathy.   Echo Stress Testing : Date: 12/15/20 Results: Results: Mild PVCs with exercise and recovery Hypertensive Response No significant inducible gradient Plan: If BP remains elevated on ambulatory BP monitoring, start aldactone 12.5 mg PO Daily and BMP in 7-10 days  Cardiac Event Monitoring: Date: 10/29/20 Results: Patient had a minimum heart rate of 35 bpm, maximum heart rate of 108 bpm, and average heart rate of 56 bpm. Predominant underlying rhythm was sinus bradycardia. Isolated PACs were rare (<1.0%). Isolated PVCs were rare (<1.0%). 1st degree heart block was present. No triggered and diary events.   No malignant arrhythmias.  CMR: Date: 11/13/20 Results: IMPRESSION: Wall thickness greater than 15 mm without MRI findings suggestive of infiltrative disease.   Study meets imaging criteria for hypertrophic cardiomyopathy.   Recent Labs: 10/06/2020: Hemoglobin 13.0; Platelets  218 01/20/2021: BUN 19; Creatinine, Ser 1.19; Potassium 4.3; Sodium 138  Recent Lipid Panel No results found for: CHOL, TRIG, HDL, CHOLHDL, VLDL, LDLCALC, LDLDIRECT   Physical Exam:    VS:  BP 130/60   Pulse 69   Ht 5\' 5"  (1.651 m)   Wt 163 lb (73.9 kg)   SpO2 99%   BMI 27.12 kg/m     Wt Readings from Last 3 Encounters:  02/23/21 163 lb (73.9 kg)  11/24/20 163 lb 3.2 oz (74 kg)  10/06/20 165 lb 6.4 oz (75 kg)     Gen: no distress   Neck: No JVD,  Ears: Bilateral Pilar Plate Sign Cardiac: No Rubs or Gallops, I/6 soft systolic Murmur-> 3/6 late systolic murmur with standing,  normal heart rate and +2 radial pulses Respiratory: Clear to auscultation bilaterally, normal effort, normal  respiratory rate GI: Soft, nontender, non-distended  MS: No  edema;  moves all extremities Integument: Skin feels warm Neuro:  At time of evaluation, alert and oriented to person/place/time/situation  Psych: Normal affect, patient feels well   ASSESSMENT:    1. HOCM (hypertrophic obstructive cardiomyopathy) ([place])   2. Family history of sudden cardiac death (SCD)   3. Hyperlipidemia, unspecified hyperlipidemia type      PLAN:    In order of problems listed above:  Hypertrophic Cardiomyopathy with LVOT obstruction Hypertensive Heart disease with hypertrophic phenocopy Family history of SCD - Sigmoid Septal Variant;  on resting study peak gradient 73 mm Hg- with late peaking MR, EF above 50% - Family history SCD, discussed 1st degree family history screening at initial visit (2022) - Septal wall thickness of 15 mm - Echo from 7/22 notable for LVOTO, without inducible LVOTO on stress - CMR without scar 2022 - absence of atrial fibrillation -  2 year assessment for VT on rhythm monitor done 10/29/20 without ectopy - continue verapamil 180 mg PO BID but may be able to tolerate higher dose, will trial this if worsening sx - on lisinopril 40 mg, hydralazine 10 BID, Aldactone 25 mg PO daily, and  vepamil 180 BP is controlled today and at ambulatory readings - at 6 month visit, will consider limited for HCM - in 2024 we will discuss the pros and cons of SCD risk testing is moderate in the setting of unclear death of grandparents.  Patient is age greater than 13 without significant ectopy.  We discussed the pros and cons of ICD but will defer at this time, in subsequent visits we will discuss ICD and if further screening is warranted  HLD - on statin, LDL goal < 100 without other calcifications - at goal; at next visit may offer CAC  Six months with me    Medication Adjustments/Labs and Tests Ordered: Current medicines are reviewed at length with the patient today.  Concerns regarding medicines are outlined above.  No orders of the defined types were placed in this encounter.   No orders of the defined types were placed in this encounter.   Patient Instructions  Medication Instructions:  Your physician recommends that you continue on your current medications as directed. Please refer to the Current Medication list given to you today.  *If you need a refill on your cardiac medications before your next appointment, please call your pharmacy*   Lab Work: NONE If you have labs (blood work) drawn today and your tests are completely normal, you will receive your results only by: [place] (if you have MyChart) OR A paper copy in the mail If you have any lab test that is abnormal or we need to change your treatment, we will call you to review the results.   Testing/Procedures: NONE   Follow-Up: At [facility], you and your health needs are our priority.  As part of our continuing mission to provide you with exceptional heart care, we have created designated Provider Care Teams.  These Care Teams include your primary Cardiologist (physician) and Advanced Practice Providers (APPs -  Physician Assistants and Nurse Practitioners) who all work together to provide you with  the care you need, when you need it.   Your next appointment:   6 month(s)  The format for your next appointment:   In Person  Provider:   Werner Lean, MD        Signed, Werner Lean, MD  02/23/2021 8:59 AM    [place]

## 2021-02-23 ENCOUNTER — Encounter: Payer: Self-pay | Admitting: Internal Medicine

## 2021-02-23 ENCOUNTER — Other Ambulatory Visit: Payer: Self-pay

## 2021-02-23 ENCOUNTER — Ambulatory Visit: Payer: PPO | Admitting: Internal Medicine

## 2021-02-23 VITALS — BP 130/60 | HR 69 | Ht 65.0 in | Wt 163.0 lb

## 2021-02-23 DIAGNOSIS — I421 Obstructive hypertrophic cardiomyopathy: Secondary | ICD-10-CM | POA: Diagnosis not present

## 2021-02-23 DIAGNOSIS — E785 Hyperlipidemia, unspecified: Secondary | ICD-10-CM

## 2021-02-23 DIAGNOSIS — Z8241 Family history of sudden cardiac death: Secondary | ICD-10-CM

## 2021-02-23 DIAGNOSIS — M542 Cervicalgia: Secondary | ICD-10-CM | POA: Diagnosis not present

## 2021-02-23 NOTE — Patient Instructions (Signed)
Medication Instructions:  Your physician recommends that you continue on your current medications as directed. Please refer to the Current Medication list given to you today.  *If you need a refill on your cardiac medications before your next appointment, please call your pharmacy*   Lab Work: NONE If you have labs (blood work) drawn today and your tests are completely normal, you will receive your results only by: [place] (if you have MyChart) OR A paper copy in the mail If you have any lab test that is abnormal or we need to change your treatment, we will call you to review the results.   Testing/Procedures: NONE   Follow-Up: At [facility] you and your health needs are our priority.  As part of our continuing mission to provide you with exceptional heart care, we have created designated Provider Care Teams.  These Care Teams include your primary Cardiologist (physician) and Advanced Practice Providers (APPs -  Physician Assistants and Nurse Practitioners) who all work together to provide you with the care you need, when you need it.   Your next appointment:   6 month(s)  The format for your next appointment:   In Person  Provider:   Werner Lean, MD

## 2021-03-01 DIAGNOSIS — M47812 Spondylosis without myelopathy or radiculopathy, cervical region: Secondary | ICD-10-CM | POA: Diagnosis not present

## 2021-03-16 DIAGNOSIS — C61 Malignant neoplasm of prostate: Secondary | ICD-10-CM | POA: Diagnosis not present

## 2021-03-25 DIAGNOSIS — M47812 Spondylosis without myelopathy or radiculopathy, cervical region: Secondary | ICD-10-CM | POA: Diagnosis not present

## 2021-03-31 DIAGNOSIS — E7800 Pure hypercholesterolemia, unspecified: Secondary | ICD-10-CM | POA: Diagnosis not present

## 2021-03-31 DIAGNOSIS — R7309 Other abnormal glucose: Secondary | ICD-10-CM | POA: Diagnosis not present

## 2021-03-31 DIAGNOSIS — I421 Obstructive hypertrophic cardiomyopathy: Secondary | ICD-10-CM | POA: Diagnosis not present

## 2021-03-31 DIAGNOSIS — M5136 Other intervertebral disc degeneration, lumbar region: Secondary | ICD-10-CM | POA: Diagnosis not present

## 2021-03-31 DIAGNOSIS — Z79899 Other long term (current) drug therapy: Secondary | ICD-10-CM | POA: Diagnosis not present

## 2021-03-31 DIAGNOSIS — I10 Essential (primary) hypertension: Secondary | ICD-10-CM | POA: Diagnosis not present

## 2021-03-31 DIAGNOSIS — Z0001 Encounter for general adult medical examination with abnormal findings: Secondary | ICD-10-CM | POA: Diagnosis not present

## 2021-04-16 DIAGNOSIS — M47812 Spondylosis without myelopathy or radiculopathy, cervical region: Secondary | ICD-10-CM | POA: Diagnosis not present

## 2021-06-24 ENCOUNTER — Telehealth: Payer: Self-pay | Admitting: Internal Medicine

## 2021-06-24 MED ORDER — SPIRONOLACTONE 25 MG PO TABS: 25.0000 mg | ORAL_TABLET | Freq: Every day | ORAL | 2 refills | Status: DC

## 2021-06-24 NOTE — Telephone Encounter (Signed)
Pt's medication was sent to pt's pharmacy as requested. Confirmation received.  °

## 2021-06-24 NOTE — Telephone Encounter (Signed)
?*  STAT* If patient is at the pharmacy, call can be transferred to refill team. ? ? ?1. Which medications need to be refilled? (please list name of each medication and dose if known) spironolactone (ALDACTONE) 25 MG tablet ? ?2. Which pharmacy/location (including street and city if local pharmacy) is medication to be sent to? [id number] [place] [address], [place] - [address]? ?3. Do they need a 30 day or 90 day supply? 90 ? ?

## 2021-07-20 ENCOUNTER — Other Ambulatory Visit: Payer: Self-pay | Admitting: Urology

## 2021-07-20 DIAGNOSIS — C61 Malignant neoplasm of prostate: Secondary | ICD-10-CM

## 2021-08-26 DIAGNOSIS — J0110 Acute frontal sinusitis, unspecified: Secondary | ICD-10-CM | POA: Diagnosis not present

## 2021-09-09 ENCOUNTER — Ambulatory Visit
Admission: RE | Admit: 2021-09-09 | Discharge: 2021-09-09 | Disposition: A | Discharge status: Home / Self Care | Payer: PPO | Source: Ambulatory Visit | Attending: Urology | Admitting: Urology

## 2021-09-09 DIAGNOSIS — C61 Malignant neoplasm of prostate: Secondary | ICD-10-CM | POA: Diagnosis not present

## 2021-09-09 DIAGNOSIS — K5730 Diverticulosis of large intestine without perforation or abscess without bleeding: Secondary | ICD-10-CM | POA: Diagnosis not present

## 2021-09-09 MED ORDER — GADOBENATE DIMEGLUMINE 529 MG/ML IV SOLN
15.0000 mL | Freq: Once | INTRAVENOUS | Status: AC | PRN
  Administered 2021-09-09: 15 mL via INTRAVENOUS

## 2021-09-16 DIAGNOSIS — C61 Malignant neoplasm of prostate: Secondary | ICD-10-CM | POA: Diagnosis not present

## 2021-09-21 ENCOUNTER — Other Ambulatory Visit: Payer: Self-pay | Admitting: Internal Medicine

## 2021-09-23 DIAGNOSIS — C61 Malignant neoplasm of prostate: Secondary | ICD-10-CM | POA: Diagnosis not present

## 2021-10-13 ENCOUNTER — Encounter: Payer: Self-pay | Admitting: Internal Medicine

## 2021-10-13 ENCOUNTER — Ambulatory Visit: Payer: PPO | Admitting: Internal Medicine

## 2021-10-13 VITALS — BP 150/70 | HR 57 | Ht 66.0 in | Wt 160.0 lb

## 2021-10-13 DIAGNOSIS — I10 Essential (primary) hypertension: Secondary | ICD-10-CM

## 2021-10-13 DIAGNOSIS — I421 Obstructive hypertrophic cardiomyopathy: Secondary | ICD-10-CM

## 2021-10-13 MED ORDER — SPIRONOLACTONE 50 MG PO TABS: 50.0000 mg | ORAL_TABLET | Freq: Every day | ORAL | 3 refills | Status: DC

## 2021-10-13 NOTE — Progress Notes (Signed)
Cardiology Office Note:    Date:  10/13/2021   ID:  KASE SHUGHART, DOB Oct 05, 1950, MRN [id number]  PCP:  Lujean Amel, MD   [facility] Providers Cardiologist:  Werner Lean, MD     CC: HCM f/u  History of Present Illness:    Logan Wu is a 71 y.o. male with a hx of hypertension and oHCM who presents for evaluation 10/06/20.  2022: Normal CMR without scar and normal heart monitor, Increased spironolactone  Doing well- golf going well, volunteering going well).  Notes that when he gets up quickly gets a bit of dizziness. Started gabapentin and doesn't lot it.  Patient notes no SOB at rest and no DOE. Notes no fatigue. Notes no palpitations Notes no CP. Notes no syncope. Notable family events include no issues. [place].  Ambulatory BP 120/57   Past Medical History:  Diagnosis Date   Hypertension    Prostate cancer ([place])    Wears contact lenses     Past Surgical History:  Procedure Laterality Date   COLONOSCOPY     disk fusion  1992   cervical   FLEXOR TENDON REPAIR Right 01/24/2014   Procedure: RIGHT HAND,LONG,RING,SMALL FINGERS EXPLORATION WITH REPAIR  OF FLEXOR TENDON  SMALL FINGER;  Surgeon: Leanora Cover, MD;  Location: [facility];  Service: Orthopedics;  Laterality: Right;   PROSTATE BIOPSY      Current Medications: Current Meds  Medication Sig   atorvastatin (LIPITOR) 20 MG tablet Take 20 mg by mouth daily.   cholecalciferol (VITAMIN D) 1000 UNITS tablet Take 1,000 Units by mouth daily.   gabapentin (NEURONTIN) 100 MG capsule Take 100 mg by mouth as directed. 2 tab ('200mg'$ ) for 15days, 1 tab ('100mg'$ ) for 15 days   gabapentin (NEURONTIN) 300 MG capsule Take 300 mg by mouth at bedtime.   lisinopril (PRINIVIL,ZESTRIL) 40 MG tablet Take 40 mg by mouth daily.   mometasone (ELOCON) 0.1 % ointment Apply topically as needed.   Multiple Vitamins-Minerals (MULTIVITAMIN WITH MINERALS) tablet  Take 1 tablet by mouth daily.   spironolactone (ALDACTONE) 50 MG tablet Take 1 tablet (50 mg total) by mouth daily.   verapamil (VERELAN PM) 180 MG 24 hr capsule Take 180 mg by mouth in the morning and at bedtime.   [DISCONTINUED] atorvastatin (LIPITOR) 10 MG tablet Take 10 mg by mouth daily.   [DISCONTINUED] hydrALAZINE (APRESOLINE) 10 MG tablet TAKE 1 TABLET BY MOUTH TWICE A DAY   [DISCONTINUED] spironolactone (ALDACTONE) 25 MG tablet Take 1 tablet (25 mg total) by mouth daily.     Allergies:   Penicillins   Social History   Socioeconomic History   Marital status: Married    Spouse name: Not on file   Number of children: 3   Years of education: Not on file   Highest education level: Not on file  Occupational History    Comment: retired  Tobacco Use   Smoking status: Never   Smokeless tobacco: Never  Vaping Use   Vaping Use: Never used  Substance and Sexual Activity   Alcohol use: Yes    Alcohol/week: 1.0 standard drink of alcohol    Types: 1 Cans of beer per week   Drug use: No   Sexual activity: Yes  Other Topics Concern   Not on file  Social History Narrative   Not on file   Social Determinants of Health   Financial Resource Strain: Not on file  Food Insecurity: Not on file  Transportation Needs: Not on file  Physical Activity: Not on file  Stress: Not on file  Social Connections: Not on file   Social:  [name], married, 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 There is no history of Breast cancer, Colon cancer, or Pancreatic cancer. M GF died suddenly and collapsed at age 86, unclear etiology F GF died suddenly.  ROS:   Please see the history of present illness.     All other systems reviewed and are negative.  EKGs/Labs/Other Studies Reviewed:    The following studies were reviewed today:  EKG:   08/13/21: Sinus bradycardia  1st HB 10/06/20: Sinus Bradycardia rate 53 LVH  Transthoracic Echocardiogram: Date: 09/30/20 Results: Moderate BSH; LVOTO Peak Gradient 73 mm Hg LVEF preserved  1. There is hypertrophic obstructive cardiomyopathy, peak velocity 6.5  m/s with peak gradient of 168 mmHg during Valsalva, SAM (systolic anterior  motion of the mitral valve), thickened septum. Left ventricular ejection  fraction, by estimation, is 65 to  70%. The left ventricle has normal function. The left ventricle has no  regional wall motion abnormalities. There is moderate asymmetric left  ventricular hypertrophy of the basal-septal segment. Left ventricular  diastolic parameters are consistent with  Grade I diastolic dysfunction (impaired relaxation).   2. Right ventricular systolic function is normal. The right ventricular  size is normal.   3. There is systolic anterior motion of the mitral valve. The mitral  valve is normal in structure. Moderate mitral valve regurgitation. No  evidence of mitral stenosis.   4. The aortic valve is normal in structure. Aortic valve regurgitation is  trivial. No aortic stenosis is present.   5. The inferior vena cava is normal in size with greater than 50%  respiratory variability, suggesting right atrial pressure of 3 mmHg.   Conclusion(s)/Recommendation(s): Findings consistent with hypertrophic  obstructive cardiomyopathy.   Echo Stress Testing : Date: 12/15/20 Results: Results: Mild PVCs with exercise and recovery Hypertensive Response No significant inducible gradient Plan: If BP remains elevated on ambulatory BP monitoring, start aldactone 12.5 mg PO Daily and BMP in 7-10 days  Cardiac Event Monitoring: Date: 10/29/20 Results: Patient had a minimum heart rate of 35 bpm, maximum heart rate of 108 bpm, and average heart rate of 56 bpm. Predominant underlying rhythm was sinus bradycardia. Isolated PACs were rare (<1.0%). Isolated PVCs were rare (<1.0%). 1st degree heart  block was present. No triggered and diary events.   No malignant arrhythmias.  CMR: Date: 11/13/20 Results: IMPRESSION: Wall thickness greater than 15 mm without MRI findings suggestive of infiltrative disease.   Study meets imaging criteria for hypertrophic cardiomyopathy.   Recent Labs: 01/20/2021: BUN 19; Creatinine, Ser 1.19; Potassium 4.3; Sodium 138  Recent Lipid Panel No results found for: "CHOL", "TRIG", "HDL", "CHOLHDL", "VLDL", "LDLCALC", "LDLDIRECT"   Physical Exam:    VS:  BP (!) 150/70   Pulse (!) 57   Ht '5\' 6"'$  (1.676 m)   Wt 160 lb (72.6 kg)   SpO2 99%   BMI 25.82 kg/m     Wt Readings from Last 3 Encounters:  10/13/21 160 lb (72.6 kg)  02/23/21 163 lb (73.9 kg)  11/24/20 163 lb 3.2 oz (74 kg)    Gen: no distress   Neck: No JVD,  Ears: Bilateral Pilar Plate Sign Cardiac: No Rubs or Gallops, 2/6 harsh systolic murmur with standing,  normal heart rate and +2 radial pulses Respiratory: Clear to auscultation bilaterally, normal effort, normal  respiratory rate GI: Soft, nontender, non-distended  MS: No  edema;  moves all extremities Integument: Skin feels warm Neuro:  At time of evaluation, alert and oriented to person/place/time/situation  Psych: Normal affect, patient feels good  ASSESSMENT:    1. HOCM (hypertrophic obstructive cardiomyopathy) ([place])   2. Essential hypertension     PLAN:    Hypertrophic Cardiomyopathy with baseline LVOT obstruction (Favored over hypertensive phenocopy) Family history of SCD SR 1st HB - Sigmoid Septal Variant; - Family history SCD, discussed 1st degree family history screening at initial visit (2022); 1st family members were screened - Septal wall thickness of 15 mm 2023, no scar (2022) - absence of atrial fibrillation - maximal exercise gradient 30 mm Hg - continue verapamil 180 mg PO BID  - on lisinopril 40 mg, hydralazine 10 BID, Aldactone 25 mg PO daily, and vepamil 180 - will do trial of aldactone 50 and stop  hydralazine  - BMP in two weeks and will f/u amb BP's - SCD risks with grandparents unexplained deaths- POET in 2024; will me more conservative  - Echo in 2024, planned to see in 2024  HLD - on statin, LDL goal < 100 without other calcifications - deferring CAC for now, will re-discuss in 2024 based on baseline lipids  Six months with me    Medication Adjustments/Labs and Tests Ordered: Current medicines are reviewed at length with the patient today.  Concerns regarding medicines are outlined above.  Orders Placed This Encounter  Procedures   Basic metabolic panel   EXERCISE TOLERANCE TEST (ETT)   EKG 12-Lead   ECHOCARDIOGRAM COMPLETE     Meds ordered this encounter  Medications   spironolactone (ALDACTONE) 50 MG tablet    Sig: Take 1 tablet (50 mg total) by mouth daily.    Dispense:  90 tablet    Refill:  3     Patient Instructions  Medication Instructions:  Your physician has recommended you make the following change in your medication:  STOP: hydralazine INCREASE: spironolactone (Aldactone) to 50 mg by mouth once daily   *If you need a refill on your cardiac medications before your next appointment, please call your pharmacy*   Lab Work: IN 2 WEEKS: BMP If you have labs (blood work) drawn today and your tests are completely normal, you will receive your results only by: [place] (if you have MyChart) OR A paper copy in the mail If you have any lab test that is abnormal or we need to change your treatment, we will call you to review the results.   Testing/Procedures: Jan 2024- Your physician has requested that you have an echocardiogram. Echocardiography is a painless test that uses sound waves to create images of your heart. It provides your doctor with information about the size and shape of your heart and how well your heart's chambers and valves are working. This procedure takes approximately one hour. There are no restrictions for this  procedure.  Jan 2024- Your physician has requested that you have an exercise tolerance test. For further information please visit [email or web address]. Please also follow instruction sheet, as given.    Follow-Up: At [facility], you and your health needs are our priority.  As part of our continuing mission to provide you with exceptional heart care, we have created designated Provider Care Teams.  These Care Teams include your primary Cardiologist (physician) and Advanced Practice Providers (APPs -  Physician Assistants and Nurse Practitioners) who all work together to provide you with the care you need, when you need it.  Your next appointment:   6 month(s)  The format for your next appointment:   In Person  Provider:   Werner Lean, MD    Important Information About Sugar         Signed, Werner Lean, MD  10/13/2021 12:13 PM    [place]

## 2021-10-13 NOTE — Patient Instructions (Signed)
Medication Instructions:  Your physician has recommended you make the following change in your medication:  STOP: hydralazine INCREASE: spironolactone (Aldactone) to 50 mg by mouth once daily   *If you need a refill on your cardiac medications before your next appointment, please call your pharmacy*   Lab Work: IN 2 WEEKS: BMP If you have labs (blood work) drawn today and your tests are completely normal, you will receive your results only by: [place] (if you have MyChart) OR A paper copy in the mail If you have any lab test that is abnormal or we need to change your treatment, we will call you to review the results.   Testing/Procedures: Jan 2024- Your physician has requested that you have an echocardiogram. Echocardiography is a painless test that uses sound waves to create images of your heart. It provides your doctor with information about the size and shape of your heart and how well your heart's chambers and valves are working. This procedure takes approximately one hour. There are no restrictions for this procedure.  Jan 2024- Your physician has requested that you have an exercise tolerance test. For further information please visit [email or web address]. Please also follow instruction sheet, as given.    Follow-Up: At [facility], you and your health needs are our priority.  As part of our continuing mission to provide you with exceptional heart care, we have created designated Provider Care Teams.  These Care Teams include your primary Cardiologist (physician) and Advanced Practice Providers (APPs -  Physician Assistants and Nurse Practitioners) who all work together to provide you with the care you need, when you need it.  Your next appointment:   6 month(s)  The format for your next appointment:   In Person  Provider:   Werner Lean, MD    Important Information About Sugar

## 2021-10-27 ENCOUNTER — Other Ambulatory Visit: Payer: PPO

## 2021-10-27 DIAGNOSIS — I10 Essential (primary) hypertension: Secondary | ICD-10-CM | POA: Diagnosis not present

## 2021-10-27 LAB — BASIC METABOLIC PANEL
BUN/Creatinine Ratio: 27 — ABNORMAL HIGH (ref 10–24)
BUN: 40 mg/dL — ABNORMAL HIGH (ref 8–27)
CO2: 17 mmol/L — ABNORMAL LOW (ref 20–29)
Calcium: 9.7 mg/dL (ref 8.6–10.2)
Chloride: 99 mmol/L (ref 96–106)
Creatinine, Ser: 1.48 mg/dL — ABNORMAL HIGH (ref 0.76–1.27)
Glucose: 97 mg/dL (ref 70–99)
Potassium: 5.4 mmol/L — ABNORMAL HIGH (ref 3.5–5.2)
Sodium: 130 mmol/L — ABNORMAL LOW (ref 134–144)
eGFR: 50 mL/min/{1.73_m2} — ABNORMAL LOW (ref >59)

## 2021-10-28 ENCOUNTER — Other Ambulatory Visit: Payer: Self-pay | Admitting: *Deleted

## 2021-10-28 ENCOUNTER — Telehealth: Payer: Self-pay | Admitting: Internal Medicine

## 2021-10-28 DIAGNOSIS — E875 Hyperkalemia: Secondary | ICD-10-CM

## 2021-10-28 MED ORDER — SPIRONOLACTONE 50 MG PO TABS: 25.0000 mg | ORAL_TABLET | Freq: Every day | ORAL | 3 refills | Status: DC

## 2021-10-28 NOTE — Telephone Encounter (Signed)
Follow Up:      Patient is returning Christine's call from today,

## 2021-11-02 DIAGNOSIS — U071 COVID-19: Secondary | ICD-10-CM | POA: Diagnosis not present

## 2021-11-02 NOTE — Telephone Encounter (Signed)
See lab  results note ./cy 

## 2021-11-12 ENCOUNTER — Ambulatory Visit: Payer: PPO | Attending: Internal Medicine

## 2021-11-12 DIAGNOSIS — E875 Hyperkalemia: Secondary | ICD-10-CM | POA: Diagnosis not present

## 2021-11-12 LAB — BASIC METABOLIC PANEL
BUN/Creatinine Ratio: 19 (ref 10–24)
BUN: 24 mg/dL (ref 8–27)
CO2: 19 mmol/L — ABNORMAL LOW (ref 20–29)
Calcium: 9.6 mg/dL (ref 8.6–10.2)
Chloride: 97 mmol/L (ref 96–106)
Creatinine, Ser: 1.28 mg/dL — ABNORMAL HIGH (ref 0.76–1.27)
Glucose: 98 mg/dL (ref 70–99)
Potassium: 4.9 mmol/L (ref 3.5–5.2)
Sodium: 131 mmol/L — ABNORMAL LOW (ref 134–144)
eGFR: 60 mL/min/{1.73_m2} (ref >59)

## 2021-12-17 DIAGNOSIS — Z23 Encounter for immunization: Secondary | ICD-10-CM | POA: Diagnosis not present

## 2021-12-28 ENCOUNTER — Encounter: Payer: Self-pay | Admitting: Internal Medicine

## 2021-12-28 MED ORDER — HYDRALAZINE HCL 10 MG PO TABS: 10.0000 mg | ORAL_TABLET | Freq: Two times a day (BID) | ORAL | 1 refills | Status: DC

## 2021-12-28 NOTE — Telephone Encounter (Signed)
Per last ov note:  - on lisinopril 40 mg, hydralazine 10 BID, Aldactone 25 mg PO daily, and vepamil 180 - will do trial of aldactone 50 and stop hydralazine   Hyperkalemia and creatinine bump on higher dose- on 10/28/21 pt was instructed to decrease aldactone back to 25 mg and monitor bp, add hydralazine back if elevated.  Labs improved.   I advised patient to add back hydralazine 10 mg BID.  Will route to Dr. Gasper Sells to update.

## 2022-02-12 DIAGNOSIS — J0100 Acute maxillary sinusitis, unspecified: Secondary | ICD-10-CM | POA: Diagnosis not present

## 2022-03-28 DIAGNOSIS — C61 Malignant neoplasm of prostate: Secondary | ICD-10-CM | POA: Diagnosis not present

## 2022-03-29 ENCOUNTER — Ambulatory Visit (INDEPENDENT_AMBULATORY_CARE_PROVIDER_SITE_OTHER): Payer: PPO

## 2022-03-29 ENCOUNTER — Ambulatory Visit (HOSPITAL_COMMUNITY): Payer: PPO | Attending: Internal Medicine

## 2022-03-29 DIAGNOSIS — I421 Obstructive hypertrophic cardiomyopathy: Secondary | ICD-10-CM | POA: Diagnosis not present

## 2022-03-29 LAB — ECHOCARDIOGRAM COMPLETE
Area-P 1/2: 3.75 cm2
MV M vel: 5.63 m/s
MV Peak grad: 126.8 mmHg
P 1/2 time: 738 msec
S' Lateral: 2.5 cm

## 2022-03-30 LAB — EXERCISE TOLERANCE TEST
Angina Index: 0
Base ST Depression (mm): 0 mm
Duke Treadmill Score: 7
Estimated workload: 8.7
Exercise duration (min): 7 min
Exercise duration (sec): 8 s
MPHR: 149 {beats}/min
Peak HR: 127 {beats}/min
Percent HR: 85 %
RPE: 17
Rest HR: 60 {beats}/min
ST Depression (mm): 0 mm

## 2022-04-12 DIAGNOSIS — R7301 Impaired fasting glucose: Secondary | ICD-10-CM | POA: Diagnosis not present

## 2022-04-12 DIAGNOSIS — Z0001 Encounter for general adult medical examination with abnormal findings: Secondary | ICD-10-CM | POA: Diagnosis not present

## 2022-04-12 DIAGNOSIS — C61 Malignant neoplasm of prostate: Secondary | ICD-10-CM | POA: Diagnosis not present

## 2022-04-12 DIAGNOSIS — E7800 Pure hypercholesterolemia, unspecified: Secondary | ICD-10-CM | POA: Diagnosis not present

## 2022-04-12 DIAGNOSIS — R7303 Prediabetes: Secondary | ICD-10-CM | POA: Diagnosis not present

## 2022-04-12 DIAGNOSIS — Z79899 Other long term (current) drug therapy: Secondary | ICD-10-CM | POA: Diagnosis not present

## 2022-04-12 DIAGNOSIS — I421 Obstructive hypertrophic cardiomyopathy: Secondary | ICD-10-CM | POA: Diagnosis not present

## 2022-04-12 DIAGNOSIS — D649 Anemia, unspecified: Secondary | ICD-10-CM | POA: Diagnosis not present

## 2022-05-19 ENCOUNTER — Encounter: Payer: Self-pay | Admitting: Internal Medicine

## 2022-05-19 ENCOUNTER — Telehealth: Payer: Self-pay | Admitting: Internal Medicine

## 2022-05-19 ENCOUNTER — Other Ambulatory Visit: Payer: Self-pay | Admitting: Internal Medicine

## 2022-05-19 MED ORDER — HYDRALAZINE HCL 10 MG PO TABS: 10.0000 mg | ORAL_TABLET | Freq: Two times a day (BID) | ORAL | 0 refills | Status: DC

## 2022-05-19 MED ORDER — SPIRONOLACTONE 50 MG PO TABS: 25.0000 mg | ORAL_TABLET | Freq: Every day | ORAL | Status: DC

## 2022-05-19 NOTE — Telephone Encounter (Signed)
Pt has appt 06/30/22 with Dr. Gasper Sells.  Sent in 90 days supply of Spironolactone and Hydralazine to [facility], per pt request.

## 2022-05-19 NOTE — Telephone Encounter (Signed)
*  STAT* If patient is at the pharmacy, call can be transferred to refill team.   1. Which medications need to be refilled? (please list name of each medication and dose if known)  spironolactone (ALDACTONE) 50 MG tablet  hydrALAZINE (APRESOLINE) 10 MG tablet   2. Which pharmacy/location (including street and city if local pharmacy) is medication to be sent to?[id number] [place] [place] [email or web address]  3. Do they need a 30 day or 90 day supply? 90 day supply

## 2022-05-21 ENCOUNTER — Other Ambulatory Visit: Payer: Self-pay | Admitting: Internal Medicine

## 2022-06-30 ENCOUNTER — Ambulatory Visit: Payer: PPO | Admitting: Internal Medicine

## 2022-06-30 NOTE — Progress Notes (Signed)
Cardiology Office Note:    Date:  07/01/2022   ID:  Logan Wu, DOB Dec 06, 1950, MRN [id number]  PCP:  Logan Bussing, MD   [facility] Providers Cardiologist:  Logan Constant, MD     CC: HCM f/u  History of Present Illness:    Logan Wu is a 72 y.o. male with a hx of hypertension and oHCM who presents for evaluation 10/06/20.  2022: Normal CMR without scar and normal heart monitor, Increased spironolactone  Doing well- golf going well, volunteering going well). 2023: BP increase slightly and we returned the afterload reduction we had been attempting to wean.  BP improved.  Patient notes no SOB at rest and no DOE He is playing the best golf of his life. Notes no fatigue. Notes very rare palpitations Notes no CP. Notes rare dizziness when standing. Notes no syncope.  Has a big reunion with his family for his niece's beach wedding. 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  He notes that in additional to his two brothers and his two adopted children, he has a Human resources officer daughter who lives in [place]. She has two young boys.  She has had no symptoms.  Ambulatory BP 122/55   Past Medical History:  Diagnosis Date   Hypertension    Prostate cancer    Wears contact lenses     Past Surgical History:  Procedure Laterality Date   COLONOSCOPY     disk fusion  1992   cervical   FLEXOR TENDON REPAIR Right 01/24/2014   Procedure: RIGHT HAND,LONG,RING,SMALL FINGERS EXPLORATION WITH REPAIR  OF FLEXOR TENDON  SMALL FINGER;  Surgeon: Betha Loa, MD;  Location: [facility];  Service: Orthopedics;  Laterality: Right;   PROSTATE BIOPSY      Current Medications: Current Meds  Medication Sig   atorvastatin (LIPITOR) 20 MG tablet Take 20 mg by mouth daily.   cholecalciferol (VITAMIN D) 1000 UNITS tablet Take 1,000 Units by mouth daily.   hydrALAZINE (APRESOLINE) 10 MG tablet Take 1 tablet (10 mg total) by mouth 2 (two) times daily.    lisinopril (PRINIVIL,ZESTRIL) 40 MG tablet Take 40 mg by mouth daily.   mometasone (ELOCON) 0.1 % ointment Apply topically as needed.   Multiple Vitamins-Minerals (MULTIVITAMIN WITH MINERALS) tablet Take 1 tablet by mouth daily.   spironolactone (ALDACTONE) 25 MG tablet TAKE 1/2 TABLET BY MOUTH EVERY DAY   verapamil (VERELAN PM) 180 MG 24 hr capsule Take 180 mg by mouth in the morning and at bedtime.     Allergies:   Penicillins   Social History   Socioeconomic History   Marital status: Married    Spouse name: Not on file   Number of children: 3   Years of education: Not on file   Highest education level: Not on file  Occupational History    Comment: retired  Tobacco Use   Smoking status: Never   Smokeless tobacco: Never  Vaping Use   Vaping Use: Never used  Substance and Sexual Activity   Alcohol use: Yes    Alcohol/week: 1.0 standard drink of alcohol    Types: 1 Cans of beer per week   Drug use: No   Sexual activity: Yes  Other Topics Concern   Not on file  Social History Narrative   Not on file   Social Determinants of Health   Financial Resource Strain: Not on file  Food Insecurity: Not on file  Transportation Needs: Not on file  Physical Activity:  Not on file  Stress: Not on file  Social Connections: Not on file   Social:  Logan Wu, married, 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 There is no history of Breast cancer, Colon cancer, or Pancreatic cancer. M GF died suddenly and collapsed at age 39, unclear etiology F GF died suddenly.  ROS:   Please see the history of present illness.     All other systems reviewed and are negative.  EKGs/Labs/Other Studies Reviewed:    The following studies were reviewed today:  EKG:   08/13/21: Sinus bradycardia 1st HB 10/06/20: Sinus Bradycardia  rate 53 LVH  Cardiac Studies & Procedures     STRESS TESTS  EXERCISE TOLERANCE TEST (ETT) 03/30/2022  Narrative   No ST deviation was noted.   Patient exercised for 7 minutes and 8 seconds with normal blood pressure response.   There were no PVCs, no adverse arrhythmias detected.  No ventricular tachycardia.   Overall low risk exercise treadmill test with no electrocardiographic evidence of ischemia or adverse arrhythmias.   ECHOCARDIOGRAM  ECHOCARDIOGRAM COMPLETE 03/29/2022  Narrative ECHOCARDIOGRAM REPORT    Patient Name:   Logan Wu Date of Exam: 03/29/2022 Medical Rec #:  [id number]     Height:       66.0 in Accession #:    [id number]    Weight:       160.0 lb Date of Birth:  1951-02-19     BSA:          1.819 m Patient Age:    71 years      BP:           138/65 mmHg Patient Gender: M             HR:           59 bpm. Exam Location:  [address]  Procedure: 2D Echo, Cardiac Doppler, Color Doppler and Strain Analysis  Indications:     I42.1 HOCM  History:         Patient has prior history of Echocardiogram examinations, most recent 09/30/2020. Cardiomyopathy and Hypertrophic Cardiomyopathy, Signs/Symptoms:Murmur and Dizziness/Lightheadedness; Risk Factors:Family History of Coronary Artery Disease and Hypertension. HOCM.  Sonographer:     Logan Wu RDCS Referring Phys:  Logan Wu Diagnosing Phys: Logan Busman McleanMD  IMPRESSIONS   1. Left ventricular ejection fraction, by estimation, is 55 to 60%. The left ventricle has normal function. The left ventricle has no regional wall motion abnormalities. There is mild asymmetric left ventricular hypertrophy of the basal-septal segment with narrowing of the LV outflow tract and suspect mitral valve systolic anterior motion (poorly visualized, may just be chordal SAM). Peak LVOT gradient only measured at 14 mmHg though there is LVOT turbulence. Left ventricular diastolic parameters are consistent with Grade I  diastolic dysfunction (impaired relaxation). 2. Right ventricular systolic function is normal. The right ventricular size is normal. Tricuspid regurgitation signal is inadequate for assessing PA pressure. 3. Left atrial size was moderately dilated. 4. The mitral valve is abnormal, not well-visualized but possible mild mitral valve SAM (though may just be chordal SAM). Mild to moderate mitral valve regurgitation. No evidence of mitral stenosis. 5. The aortic valve is tricuspid. Aortic valve regurgitation is trivial. No aortic stenosis is present. 6. The inferior vena cava is normal in size with greater than 50% respiratory variability, suggesting right atrial pressure  of 3 mmHg. 7. Findings consistent with HOCM.  FINDINGS Left Ventricle: Left ventricular ejection fraction, by estimation, is 55 to 60%. The left ventricle has normal function. The left ventricle has no regional wall motion abnormalities. The left ventricular internal cavity size was normal in size. There is mild asymmetric left ventricular hypertrophy of the basal-septal segment. Left ventricular diastolic parameters are consistent with Grade I diastolic dysfunction (impaired relaxation).  Right Ventricle: The right ventricular size is normal. No increase in right ventricular wall thickness. Right ventricular systolic function is normal. Tricuspid regurgitation signal is inadequate for assessing PA pressure.  Left Atrium: Left atrial size was moderately dilated.  Right Atrium: Right atrial size was normal in size.  Pericardium: There is no evidence of pericardial effusion.  Mitral Valve: The mitral valve is abnormal. Mild to moderate mitral valve regurgitation. No evidence of mitral valve stenosis.  Tricuspid Valve: The tricuspid valve is normal in structure. Tricuspid valve regurgitation is not demonstrated.  Aortic Valve: The aortic valve is tricuspid. Aortic valve regurgitation is trivial. Aortic regurgitation PHT measures 738  msec. No aortic stenosis is present.  Pulmonic Valve: The pulmonic valve was normal in structure. Pulmonic valve regurgitation is trivial.  Aorta: The aortic root is normal in size and structure.  Venous: The inferior vena cava is normal in size with greater than 50% respiratory variability, suggesting right atrial pressure of 3 mmHg.  IAS/Shunts: No atrial level shunt detected by color flow Doppler.   LEFT VENTRICLE PLAX 2D LVIDd:         4.70 cm   Diastology LVIDs:         2.50 cm   LV e' medial:    7.72 cm/s LV PW:         1.00 cm   LV E/e' medial:  12.9 LV IVS:        1.20 cm   LV e' lateral:   10.80 cm/s LVOT diam:     2.40 cm   LV E/e' lateral: 9.2 LV SV:         179 LV SV Index:   98        2D Longitudinal Strain LVOT Area:     4.52 cm  2D Strain GLS (A2C):   -26.5 % 2D Strain GLS (A3C):   -16.7 % 2D Strain GLS (A4C):   -20.1 % 2D Strain GLS Avg:     -21.1 %  RIGHT VENTRICLE RV Basal diam:  3.70 cm RV S prime:     15.70 cm/s TAPSE (M-mode): 2.4 cm  LEFT ATRIUM             Index        RIGHT ATRIUM           Index LA diam:        4.00 cm 2.20 cm/m   RA Area:     16.50 cm LA Vol (A2C):   96.8 ml 53.22 ml/m  RA Volume:   45.00 ml  24.74 ml/m LA Vol (A4C):   72.3 ml 39.75 ml/m LA Biplane Vol: 87.0 ml 47.83 ml/m AORTIC VALVE LVOT Vmax:   174.50 cm/s LVOT Vmean:  115.500 cm/s LVOT VTI:    0.396 m AI PHT:      738 msec  AORTA Ao Root diam: 3.40 cm Ao Asc diam:  3.10 cm  MITRAL VALVE MV Area (PHT): cm          SHUNTS MV Decel Time: 203 msec     Systemic  VTI:  0.40 m MR Peak grad: 126.8 mmHg    Systemic Diam: 2.40 cm MR Mean grad: 77.0 mmHg MR Vmax:      563.00 cm/s MR Vmean:     407.0 cm/s MV E velocity: 99.35 cm/s MV A velocity: 105.25 cm/s MV E/A ratio:  0.94  Dalton McleanMD Electronically signed by Wilfred Lacy Signature Date/Time: 03/29/2022/2:09:28 PM    Final (Updated)    MONITORS  LONG TERM MONITOR (3-14 DAYS)  10/29/2020  Narrative  Patient had a minimum heart rate of 35 bpm, maximum heart rate of 108 bpm, and average heart rate of 56 bpm.  Predominant underlying rhythm was sinus bradycardia.  Isolated PACs were rare (<1.0%).  Isolated PVCs were rare (<1.0%).  1st degree heart block was present.  No triggered and diary events.  No malignant arrhythmias.    CARDIAC MRI  MR CARDIAC MORPHOLOGY W WO CONTRAST 11/13/2020  Narrative CLINICAL DATA:  Clinical question of hypertrophic cardiomyopathy Study assumes HCT of 38.  EXAM: CARDIAC MRI  TECHNIQUE: The patient was scanned on a 1.5 Tesla GE magnet. A dedicated cardiac coil was used. Functional imaging was done using Fiesta sequences. 2,3, and 4 chamber views were done to assess for RWMA's. Modified Simpson's rule using a short axis stack was used to calculate an ejection fraction on a dedicated work [employer] officer, [employer]. The patient received 8 cc of Gadavist. After 10 minutes inversion recovery sequences were used to assess for infiltration and scar tissue.  CONTRAST:  8 cc  of Gadavist  FINDINGS: 1. Normal left ventricular size, with LVEDD 49 mm, and LVEDVi 102 mL/m2.  Intraventricular septal thickness of 16 mm, posterior wall thickness of 7 mm, and septal to posterior ratio of 2.28.  Myocardial mass index 65 g/m2.  Normal left ventricular systolic function (LVEF =56%). There are no regional wall motion abnormalities. There is no systolic anterior motion of the mitral valve on supine assessment.  Left ventricular parametric mapping notable for normal ECV and T2 signal.  There is no late gadolinium enhancement in the left ventricular myocardium that meets the 5 standard deviation definition.  2. Normal right ventricular size with RVEDVI 98 mL/m2.  Normal right ventricular thickness.  Normal right ventricular systolic function (RVEF =59%). There are no regional wall motion abnormalities or aneurysms.  3.   Normal left and right atrial size.  4. Normal size of the aortic root, ascending aorta and pulmonary artery.  5.  No significant valvular abnormalities.  Tri-leaflet aortic valve.  6.  Normal pericardium.  No pericardial effusion.  7. Grossly, no extracardiac findings. Recommended dedicated study if concerned for non-cardiac pathology.  IMPRESSION: Wall thickness greater than 15 mm without MRI findings suggestive of infiltrative disease.  Study meets imaging criteria for hypertrophic cardiomyopathy.  Riley Lam MD   Electronically Signed By: Riley Lam M.D. On: 11/13/2020 17:22           Recent Labs: 11/12/2021: BUN 24; Creatinine, Ser 1.28; Potassium 4.9; Sodium 131  Recent Lipid Panel No results found for: "CHOL", "TRIG", "HDL", "CHOLHDL", "VLDL", "LDLCALC", "LDLDIRECT"   Physical Exam:    VS:  BP (!) 146/68 (BP Location: Left Arm, Patient Position: Sitting, Cuff Size: Normal)   Pulse 69   Ht 5\' 6"  (1.676 m)   Wt 165 lb 6.4 oz (75 kg)   SpO2 99%   BMI 26.70 kg/m     Wt Readings from Last 3 Encounters:  07/01/22 165 lb 6.4 oz (75 kg)  10/13/21 160 lb (72.6 kg)  02/23/21 163 lb (73.9 kg)    Gen: no distress   Neck: No JVD Ears: Bilateral Homero Fellers Sign Cardiac: No Rubs or Gallops, 2/6 harsh systolic murmur Valsalva,  normal heart rate and +2 radial pulses Respiratory: Clear to auscultation bilaterally, normal effort, normal  respiratory rate GI: Soft, nontender, non-distended  MS: New L leg edema +1;  moves all extremities Integument: Skin feels warm Neuro:  At time of evaluation, alert and oriented to person/place/time/situation  Psych: Normal affect, patient feels good  ASSESSMENT:    1. HOCM (hypertrophic obstructive cardiomyopathy)   2. Palpitations   3. Essential hypertension     PLAN:     Hypertrophic Cardiomyopathy Vs. Hypertensive phenocopy - No significant LVOT gradient; 2024 intracavitary gradient related to dynamic  function  - without MR, Apical Aneurysm  - NYHA I  - Family history reviewed, Discussed family screening  (Daugther needs echo; if she has issues getting it we will support her; discuss genetic testing and he will get testing discussion today)  - Exercise testing normal for normal rhythm and no ectopy - isolated eccentric hypertrophy on CMR with no LGE - zio patch pending; will move to move conservative testing strategy  BP is controlled on current therapy (AMB checked regularly) HAD AKI on full dose aldactone BP elevated off hydralazine   HLD - on statin, LDL goal < 100 without other calcifications - deferring CAC at this time   Planned for one year f/u  Time Spent Directly with Patient:   I have spent a total of 40 minutes with the patient reviewing notes, imaging, EKGs, labs and examining the patient as well as establishing an assessment and plan that was discussed personally with the patient.  > 50% of time was spent in direct patient care.    Medication Adjustments/Labs and Tests Ordered: Current medicines are reviewed at length with the patient today.  Concerns regarding medicines are outlined above.  Orders Placed This Encounter  Procedures   Ambulatory referral to Genetics   LONG TERM MONITOR (3-14 DAYS)     No orders of the defined types were placed in this encounter.    Patient Instructions  Medication Instructions:  Your physician recommends that you continue on your current medications as directed. Please refer to the Current Medication list given to you today.  *If you need a refill on your cardiac medications before your next appointment, please call your pharmacy*   Lab Work: NONE If you have labs (blood work) drawn today and your tests are completely normal, you will receive your results only by: MyChart Message (if you have MyChart) OR A paper copy in the mail If you have any lab test that is abnormal or we need to change your treatment, we will  call you to review the results.   Testing/Procedures: Your physician has requested that you wear a heart monitor.  Your physician has referred you to a Dentist.    Follow-Up: At [facility], you and your health needs are our priority.  As part of our continuing mission to provide you with exceptional heart care, we have created designated Provider Care Teams.  These Care Teams include your primary Cardiologist (physician) and Advanced Practice Providers (APPs -  Physician Assistants and Nurse Practitioners) who all work together to provide you with the care you need, when you need it.   Your next appointment:   1 year(s)  Provider:   Christell Constant, MD  Other Instructions ZIO XT- Long Term Monitor Instructions  Your physician has requested you wear a ZIO patch monitor for 3 days.  This is a single patch monitor. [employer] supplies one patch monitor per enrollment. Additional stickers are not available. Please do not apply patch if you will be having a Nuclear Stress Test,  Echocardiogram, Cardiac CT, MRI, or Chest Xray during the period you would be wearing the  monitor. The patch cannot be worn during these tests. You cannot remove and re-apply the  ZIO XT patch monitor.  Your ZIO patch monitor will be mailed 3 day USPS to your address on file. It may take 3-5 days  to receive your monitor after you have been enrolled.  Once you have received your monitor, please review the enclosed instructions. Your monitor  has already been registered assigning a specific monitor serial # to you.  Billing and Patient Assistance Program Information  We have supplied [employer] with any of your insurance information on file for billing purposes. [employer] offers a sliding scale Patient Assistance Program for patients that do not have  insurance, or whose insurance does not completely cover the cost of the ZIO monitor.  You must apply for the Patient Assistance Program  to qualify for this discounted rate.  To apply, please call [employer] at [phone], select option 4, select option 2, ask to apply for  Patient Assistance Program. [name] will ask your household income, and how many people  are in your household. They will quote your out-of-pocket cost based on that information.  [employer] will also be able to set up a 8-month, interest-free payment plan if needed.  Applying the monitor   Shave hair from upper left chest.  Hold abrader disc by orange tab. Rub abrader in 40 strokes over the upper left chest as  indicated in your monitor instructions.  Clean area with 4 enclosed alcohol pads. Let dry.  Apply patch as indicated in monitor instructions. Patch will be placed under collarbone on left  side of chest with arrow pointing upward.  Rub patch adhesive wings for 2 minutes. Remove white label marked "1". Remove the white  label marked "2". Rub patch adhesive wings for 2 additional minutes.  While looking in a mirror, press and release button in center of patch. A small green light will  flash 3-4 times. This will be your only indicator that the monitor has been turned on.  Do not shower for the first 24 hours. You may shower after the first 24 hours.  Press the button if you feel a symptom. You will hear a small click. Record Date, Time and  Symptom in the Patient Logbook.  When you are ready to remove the patch, follow instructions on the last 2 pages of Patient  Logbook. Stick patch monitor onto the last page of Patient Logbook.  Place Patient Logbook in the blue and white box. Use locking tab on box and tape box closed  securely. The blue and white box has prepaid postage on it. Please place it in the mailbox as  soon as possible. Your physician should have your test results approximately 7 days after the  monitor has been mailed back to [facility].  Call [facility] Customer Care at [phone] if you have questions regarding  your ZIO XT  patch monitor. Call them immediately if you see an orange light blinking on your  monitor.  If your monitor falls off in less than 4 days, contact our Monitor department at [phone].  If your monitor becomes loose or falls off after 4 days call [employer] at [phone] for  suggestions on securing your monitor     Signed, Logan Constant, MD  07/01/2022 3:33 PM    [facility]

## 2022-07-01 ENCOUNTER — Encounter: Payer: Self-pay | Admitting: Internal Medicine

## 2022-07-01 ENCOUNTER — Ambulatory Visit: Payer: PPO | Attending: Internal Medicine | Admitting: Internal Medicine

## 2022-07-01 ENCOUNTER — Ambulatory Visit (INDEPENDENT_AMBULATORY_CARE_PROVIDER_SITE_OTHER): Payer: PPO

## 2022-07-01 ENCOUNTER — Encounter: Payer: PPO | Admitting: Genetic Counselor

## 2022-07-01 VITALS — BP 146/68 | HR 69 | Ht 66.0 in | Wt 165.4 lb

## 2022-07-01 DIAGNOSIS — I10 Essential (primary) hypertension: Secondary | ICD-10-CM | POA: Diagnosis not present

## 2022-07-01 DIAGNOSIS — I421 Obstructive hypertrophic cardiomyopathy: Secondary | ICD-10-CM

## 2022-07-01 DIAGNOSIS — R002 Palpitations: Secondary | ICD-10-CM

## 2022-07-01 NOTE — Progress Notes (Unsigned)
Applied a 3 day Zio XT monitor to patient in the office 

## 2022-07-01 NOTE — Patient Instructions (Signed)
Medication Instructions:  Your physician recommends that you continue on your current medications as directed. Please refer to the Current Medication list given to you today.  *If you need a refill on your cardiac medications before your next appointment, please call your pharmacy*   Lab Work: NONE If you have labs (blood work) drawn today and your tests are completely normal, you will receive your results only by: MyChart Message (if you have MyChart) OR A paper copy in the mail If you have any lab test that is abnormal or we need to change your treatment, we will call you to review the results.   Testing/Procedures: Your physician has requested that you wear a heart monitor.  Your physician has referred you to a Dentist.    Follow-Up: At [facility], you and your health needs are our priority.  As part of our continuing mission to provide you with exceptional heart care, we have created designated Provider Care Teams.  These Care Teams include your primary Cardiologist (physician) and Advanced Practice Providers (APPs -  Physician Assistants and Nurse Practitioners) who all work together to provide you with the care you need, when you need it.   Your next appointment:   1 year(s)  Provider:   Christell Constant, MD     Other Instructions Logan Wu- Long Term Monitor Instructions  Your physician has requested you wear a ZIO patch monitor for 3 days.  This is a single patch monitor. [employer] supplies one patch monitor per enrollment. Additional stickers are not available. Please do not apply patch if you will be having a Nuclear Stress Test,  Echocardiogram, Cardiac CT, MRI, or Chest Xray during the period you would be wearing the  monitor. The patch cannot be worn during these tests. You cannot remove and re-apply the  ZIO XT patch monitor.  Your ZIO patch monitor will be mailed 3 day USPS to your address on file. It may take 3-5 days  to receive your  monitor after you have been enrolled.  Once you have received your monitor, please review the enclosed instructions. Your monitor  has already been registered assigning a specific monitor serial # to you.  Billing and Patient Assistance Program Information  We have supplied [employer] with any of your insurance information on file for billing purposes. [employer] offers a sliding scale Patient Assistance Program for patients that do not have  insurance, or whose insurance does not completely cover the cost of the ZIO monitor.  You must apply for the Patient Assistance Program to qualify for this discounted rate.  To apply, please call [employer] at [phone], select option 4, select option 2, ask to apply for  Patient Assistance Program. Logan Wu will ask your household income, and how many people  are in your household. They will quote your out-of-pocket cost based on that information.  [employer] will also be able to set up a 57-month, interest-free payment plan if needed.  Applying the monitor   Shave hair from upper left chest.  Hold abrader disc by orange tab. Rub abrader in 40 strokes over the upper left chest as  indicated in your monitor instructions.  Clean area with 4 enclosed alcohol pads. Let dry.  Apply patch as indicated in monitor instructions. Patch will be placed under collarbone on left  side of chest with arrow pointing upward.  Rub patch adhesive wings for 2 minutes. Remove white label marked "1". Remove the white  label marked "2". Rub patch adhesive wings  for 2 additional minutes.  While looking in a mirror, press and release button in center of patch. A small green light will  flash 3-4 times. This will be your only indicator that the monitor has been turned on.  Do not shower for the first 24 hours. You may shower after the first 24 hours.  Press the button if you feel a symptom. You will hear a small click. Record Date, Time and  Symptom in the Patient Logbook.  When you  are ready to remove the patch, follow instructions on the last 2 pages of Patient  Logbook. Stick patch monitor onto the last page of Patient Logbook.  Place Patient Logbook in the blue and white box. Use locking tab on box and tape box closed  securely. The blue and white box has prepaid postage on it. Please place it in the mailbox as  soon as possible. Your physician should have your test results approximately 7 days after the  monitor has been mailed back to [facility].  Call [facility] Customer Care at [phone] if you have questions regarding  your ZIO XT patch monitor. Call them immediately if you see an orange light blinking on your  monitor.  If your monitor falls off in less than 4 days, contact our Monitor department at [phone].  If your monitor becomes loose or falls off after 4 days call [employer] at [phone] for  suggestions on securing your monitor

## 2022-07-07 DIAGNOSIS — I421 Obstructive hypertrophic cardiomyopathy: Secondary | ICD-10-CM | POA: Diagnosis not present

## 2022-07-07 DIAGNOSIS — R002 Palpitations: Secondary | ICD-10-CM | POA: Diagnosis not present

## 2022-07-08 ENCOUNTER — Other Ambulatory Visit: Payer: Self-pay | Admitting: Internal Medicine

## 2022-07-12 ENCOUNTER — Other Ambulatory Visit: Payer: Self-pay | Admitting: Internal Medicine

## 2022-07-12 ENCOUNTER — Encounter: Payer: Self-pay | Admitting: Internal Medicine

## 2022-07-12 MED ORDER — SPIRONOLACTONE 25 MG PO TABS: 25.0000 mg | ORAL_TABLET | Freq: Every day | ORAL | 3 refills | Status: DC

## 2022-07-12 MED ORDER — SPIRONOLACTONE 25 MG PO TABS: 12.5000 mg | ORAL_TABLET | Freq: Every day | ORAL | 3 refills | Status: DC

## 2022-07-19 DIAGNOSIS — N1832 Chronic kidney disease, stage 3b: Secondary | ICD-10-CM | POA: Diagnosis not present

## 2022-07-25 ENCOUNTER — Other Ambulatory Visit: Payer: Self-pay | Admitting: Urology

## 2022-07-25 DIAGNOSIS — C61 Malignant neoplasm of prostate: Secondary | ICD-10-CM

## 2022-08-09 DIAGNOSIS — Z79899 Other long term (current) drug therapy: Secondary | ICD-10-CM | POA: Diagnosis not present

## 2022-09-01 DIAGNOSIS — N1831 Chronic kidney disease, stage 3a: Secondary | ICD-10-CM | POA: Diagnosis not present

## 2022-09-01 DIAGNOSIS — E875 Hyperkalemia: Secondary | ICD-10-CM | POA: Diagnosis not present

## 2022-09-01 DIAGNOSIS — I10 Essential (primary) hypertension: Secondary | ICD-10-CM | POA: Diagnosis not present

## 2022-09-19 ENCOUNTER — Other Ambulatory Visit: Payer: PPO

## 2022-10-31 ENCOUNTER — Ambulatory Visit
Admission: RE | Admit: 2022-10-31 | Discharge: 2022-10-31 | Disposition: A | Discharge status: Home / Self Care | Payer: PPO | Source: Ambulatory Visit | Attending: Urology | Admitting: Urology

## 2022-10-31 DIAGNOSIS — C61 Malignant neoplasm of prostate: Secondary | ICD-10-CM | POA: Diagnosis not present

## 2022-10-31 MED ORDER — GADOPICLENOL 0.5 MMOL/ML IV SOLN
8.0000 mL | Freq: Once | INTRAVENOUS | Status: AC | PRN
  Administered 2022-10-31: 8 mL via INTRAVENOUS

## 2022-11-07 DIAGNOSIS — C61 Malignant neoplasm of prostate: Secondary | ICD-10-CM | POA: Diagnosis not present

## 2022-12-07 ENCOUNTER — Other Ambulatory Visit: Payer: Self-pay | Admitting: Internal Medicine

## 2022-12-19 DIAGNOSIS — H2513 Age-related nuclear cataract, bilateral: Secondary | ICD-10-CM | POA: Diagnosis not present

## 2022-12-19 DIAGNOSIS — H5231 Anisometropia: Secondary | ICD-10-CM | POA: Diagnosis not present

## 2022-12-19 DIAGNOSIS — H52223 Regular astigmatism, bilateral: Secondary | ICD-10-CM | POA: Diagnosis not present

## 2022-12-19 DIAGNOSIS — H43813 Vitreous degeneration, bilateral: Secondary | ICD-10-CM | POA: Diagnosis not present

## 2022-12-19 DIAGNOSIS — H5213 Myopia, bilateral: Secondary | ICD-10-CM | POA: Diagnosis not present

## 2022-12-19 DIAGNOSIS — Q100 Congenital ptosis: Secondary | ICD-10-CM | POA: Diagnosis not present

## 2023-04-24 DIAGNOSIS — C61 Malignant neoplasm of prostate: Secondary | ICD-10-CM | POA: Diagnosis not present

## 2023-06-05 ENCOUNTER — Encounter (INDEPENDENT_AMBULATORY_CARE_PROVIDER_SITE_OTHER): Payer: Self-pay | Admitting: Internal Medicine

## 2023-06-05 DIAGNOSIS — I10 Essential (primary) hypertension: Secondary | ICD-10-CM

## 2023-06-05 DIAGNOSIS — I421 Obstructive hypertrophic cardiomyopathy: Secondary | ICD-10-CM | POA: Diagnosis not present

## 2023-06-05 DIAGNOSIS — R079 Chest pain, unspecified: Secondary | ICD-10-CM | POA: Diagnosis not present

## 2023-06-05 NOTE — Telephone Encounter (Signed)
 Called pt in regards to my chart message r/t right sided CP.  Pt reports pain started about 4-5 days ago.   Pain started in middle of chest and moved to right side of chest.  Has noted increased SOB and difficulty taking a deep breath at times.  Reports can do normal activities of daily living just a little slower.  Denies coughing up blood no recent surgeries or injuries.  Denies hx of GERD and palpitations.  Reports plays golf but doesn't think pain is related.  BP has been elevated 150's normally 120-130's.  PCP decreased dose of lisinopril to 20 mg and spironolactone to 12.5 mg d/t kidney function.  Pt reports this is not a recent change.  Advised pt of ED precautions will send to MD to advise.  Advised pt may want to consider reaching out to PCP office.

## 2023-06-06 ENCOUNTER — Encounter: Payer: Self-pay | Admitting: Internal Medicine

## 2023-06-06 ENCOUNTER — Ambulatory Visit (HOSPITAL_COMMUNITY): Attending: Internal Medicine

## 2023-06-06 DIAGNOSIS — I421 Obstructive hypertrophic cardiomyopathy: Secondary | ICD-10-CM | POA: Insufficient documentation

## 2023-06-06 DIAGNOSIS — I10 Essential (primary) hypertension: Secondary | ICD-10-CM | POA: Diagnosis not present

## 2023-06-06 LAB — ECHOCARDIOGRAM COMPLETE
Area-P 1/2: 2.95 cm2
MV M vel: 6.36 m/s
MV Peak grad: 161.8 mmHg
P 1/2 time: 611 ms
S' Lateral: 1.2 cm

## 2023-06-06 MED ORDER — HYDRALAZINE HCL 25 MG PO TABS: 25.0000 mg | ORAL_TABLET | Freq: Two times a day (BID) | ORAL | 1 refills | Status: DC

## 2023-06-06 NOTE — Telephone Encounter (Signed)
 Mr. Signorelli is a 73 year old patient with atypical chest pain in the setting of hypertension (stage 1) CKD NOS, and obstructive hypertrophic cardiomyopathy previously resolved with verapamil.   BP is up.  Notes Chest discomfort.  Curious if this is keeping him from golfing.    DDX includes worsening obstruction or hypertension.  Previously heart rates high 60s and low 70s.   - would trial increase to verapamil to 240 mg PO daily for both. - would repeat echocardiogram (last 03/29/2022) and follow up with me or my team after - if high risk CP features, should seek ED care as appropriate.  Riley Lam, MD FASE [facility] Cardiologist [facility]  [address] [place], [address] [place], [place] [address] [phone]  8:50 AM  Please see the MyChart message reply(ies) for my assessment and plan.    This patient gave consent for this Medical Advice Message and is aware that it may result in a bill to [employer], as well as the possibility of receiving a bill for a co-payment or deductible. They are an established patient, but are not seeking medical advice exclusively about a problem treated during an in person or video visit in the last seven days. I did not recommend an in person or video visit within seven days of my reply.    I spent a total of 9 minutes cumulative time within 7 days through [employer].  Christell Constant, MD

## 2023-06-06 NOTE — Addendum Note (Signed)
 Addended by: Macie Burows on: 06/06/2023 01:01 PM   Modules accepted: Orders

## 2023-06-19 DIAGNOSIS — H5231 Anisometropia: Secondary | ICD-10-CM | POA: Diagnosis not present

## 2023-06-19 DIAGNOSIS — H2513 Age-related nuclear cataract, bilateral: Secondary | ICD-10-CM | POA: Diagnosis not present

## 2023-06-19 DIAGNOSIS — H5213 Myopia, bilateral: Secondary | ICD-10-CM | POA: Diagnosis not present

## 2023-06-19 DIAGNOSIS — H52223 Regular astigmatism, bilateral: Secondary | ICD-10-CM | POA: Diagnosis not present

## 2023-06-19 DIAGNOSIS — H524 Presbyopia: Secondary | ICD-10-CM | POA: Diagnosis not present

## 2023-06-19 DIAGNOSIS — Q100 Congenital ptosis: Secondary | ICD-10-CM | POA: Diagnosis not present

## 2023-06-19 DIAGNOSIS — H43813 Vitreous degeneration, bilateral: Secondary | ICD-10-CM | POA: Diagnosis not present

## 2023-07-10 ENCOUNTER — Ambulatory Visit: Payer: PPO | Attending: Internal Medicine | Admitting: Internal Medicine

## 2023-07-10 ENCOUNTER — Encounter: Payer: Self-pay | Admitting: Internal Medicine

## 2023-07-10 VITALS — BP 148/66 | HR 56 | Ht 66.0 in | Wt 164.4 lb

## 2023-07-10 DIAGNOSIS — R079 Chest pain, unspecified: Secondary | ICD-10-CM | POA: Diagnosis not present

## 2023-07-10 DIAGNOSIS — I421 Obstructive hypertrophic cardiomyopathy: Secondary | ICD-10-CM | POA: Diagnosis not present

## 2023-07-10 DIAGNOSIS — R002 Palpitations: Secondary | ICD-10-CM

## 2023-07-10 DIAGNOSIS — I10 Essential (primary) hypertension: Secondary | ICD-10-CM | POA: Diagnosis not present

## 2023-07-10 NOTE — Patient Instructions (Signed)

## 2023-07-10 NOTE — Progress Notes (Signed)
 Cardiology Office Note:    Date:  07/10/2023   ID:  Logan Wu, DOB 1950-11-17, MRN [id number]  PCP:  Lanae Pinal, MD   [facility] Providers Cardiologist:  Jann Melody, MD     CC: HCM f/u  History of Present Illness:    Logan Wu is a 73 y.o. male with a hx of hypertension and oHCM who presents for evaluation 10/06/20.  2022: Normal CMR without scar and normal heart monitor, Increased spironolactone   Doing well- golf going well, volunteering going well). 2023: BP increase slightly and we returned the afterload reduction we had been attempting to wean.  BP improved. 2024: No sx, deferred genetic testing 2025: Called in with new CP (two days),  Found to have severe obstructive HCM on induction techniques.   Logan Wu is a 73 year old male with hypertrophic cardiomyopathy and hypertension who presents for evaluation of obstructive hypertrophic cardiomyopathy symptoms.  He has a history of hypertrophic cardiomyopathy and hypertension, with the latter being better managed over the years. Recently, he was found to have obstructive hypertrophic cardiomyopathy with an induced LVOT gradient of 80.  He experienced chest pain over a two to three day period, associated with a blood pressure reading of 150. There have been no further episodes of chest pain since then. His home blood pressure readings have been generally stable, ranging from the high 120s to low 140s, with a recent reading of 136/54.  He experiences sleep disturbances, waking up in the middle of the night and having difficulty returning to sleep. He sometimes snores, as noted by his wife. He has not been tested for obstructive sleep apnea. He practices breathing exercises to help with sleep.  No breathing issues or significant lightheadedness, although he sometimes feels lightheaded when standing up quickly. He remains active, playing golf and volunteering, and reports feeling generally well.  Family  history includes a discussion about his daughter, who has not undergone genetic testing for hypertrophic cardiomyopathy.  Past Medical History:  Diagnosis Date   Hypertension    Prostate cancer (HCC)    Wears contact lenses     Past Surgical History:  Procedure Laterality Date   COLONOSCOPY     disk fusion  1992   cervical   FLEXOR TENDON REPAIR Right 01/24/2014   Procedure: RIGHT HAND,LONG,RING,SMALL FINGERS EXPLORATION WITH REPAIR  OF FLEXOR TENDON  SMALL FINGER;  Surgeon: Brunilda Capra, MD;  Location: [facility];  Service: Orthopedics;  Laterality: Right;   PROSTATE BIOPSY      Current Medications: Current Meds  Medication Sig   atorvastatin (LIPITOR) 20 MG tablet Take 20 mg by mouth daily.   cholecalciferol (VITAMIN D) 1000 UNITS tablet Take 1,000 Units by mouth daily.   hydrALAZINE  (APRESOLINE ) 25 MG tablet Take 1 tablet (25 mg total) by mouth 2 (two) times daily.   lisinopril (PRINIVIL,ZESTRIL) 40 MG tablet Take 40 mg by mouth daily.   Multiple Vitamins-Minerals (MULTIVITAMIN WITH MINERALS) tablet Take 1 tablet by mouth daily.   spironolactone  (ALDACTONE ) 25 MG tablet Take 1 tablet (25 mg total) by mouth daily.   verapamil (VERELAN PM) 180 MG 24 hr capsule Take 180 mg by mouth in the morning and at bedtime.     Allergies:   Penicillins   Social History   Socioeconomic History   Marital status: Married    Spouse name: Not on file   Number of children: 3   Years of education: Not on file   Highest  education level: Not on file  Occupational History    Comment: retired  Tobacco Use   Smoking status: Never   Smokeless tobacco: Never  Vaping Use   Vaping status: Never Used  Substance and Sexual Activity   Alcohol use: Yes    Alcohol/week: 1.0 standard drink of alcohol    Types: 1 Cans of beer per week   Drug use: No   Sexual activity: Yes  Other Topics Concern   Not on file  Social History Narrative   Not on file   Social Drivers of Health    Financial Resource Strain: Not on file  Food Insecurity: Not on file  Transportation Needs: Not on file  Physical Activity: Not on file  Stress: Not on file  Social Connections: Not on file   Social:  Glenice Lang, married, 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 There is no history of Breast cancer, Colon cancer, or Pancreatic cancer. M GF died suddenly and collapsed at age 20, unclear etiology F GF died suddenly.  ROS:   Please see the history of present illness.     EKGs/Labs/Other Studies Reviewed:    The following studies were reviewed today:    Cardiac Studies & Procedures   ______________________________________________________________________________________________   STRESS TESTS  EXERCISE TOLERANCE TEST (ETT) 03/29/2022  Narrative   No ST deviation was noted.   Patient exercised for 7 minutes and 8 seconds with normal blood pressure response.   There were no PVCs, no adverse arrhythmias detected.  No ventricular tachycardia.   Overall low risk exercise treadmill test with no electrocardiographic evidence of ischemia or adverse arrhythmias.   ECHOCARDIOGRAM  ECHOCARDIOGRAM COMPLETE 06/06/2023  Narrative ECHOCARDIOGRAM REPORT    Patient Name:   Logan Wu Date of Exam: 06/06/2023 Medical Rec #:  [id number]     Height:       66.0 in Accession #:    [id number]    Weight:       165.4 lb Date of Birth:  June 28, 1950     BSA:          1.845 m Patient Age:    73 years      BP:           146/68 mmHg Patient Gender: M             HR:           57 bpm. Exam Location:  [address]  Procedure: 2D Echo, 3D Echo, Cardiac Doppler, Color Doppler and Strain Analysis (Both Spectral and Color Flow Doppler were utilized during procedure).  Indications:    I42.1 HOCM  History:        Patient has  prior history of Echocardiogram examinations, most recent 03/29/2022. Hypertrophic Cardiomyopathy, Signs/Symptoms:Shortness of Breath; Risk Factors:Hypertension. History of Prostate Cancer.  Sonographer:    Ewing Holiday RDCS Referring Phys: [facility] A Cedrik Heindl  IMPRESSIONS   1. Left ventricular ejection fraction, by estimation, is 60 to 65%. The left ventricle has normal function. The left ventricle has no regional wall motion abnormalities. There is moderate asymmetric left ventricular hypertrophy of the septal segment. Baseline LV outflow peak gradient 21 mmHg increasing to 80 mmHg with Valsalva. Left ventricular diastolic parameters are consistent with Grade I diastolic dysfunction (impaired relaxation). The average left ventricular global longitudinal strain is -26.1 %. The global longitudinal strain is normal.  2. Right ventricular systolic function is normal. The right ventricular size is normal. There is normal pulmonary artery systolic pressure. The estimated right ventricular systolic pressure is 22.2 mmHg. 3. The aortic valve is tricuspid. There is mild calcification of the aortic valve. Aortic valve regurgitation is trivial. No aortic stenosis is present. 4. The mitral valve is abnormal with mild systolic anterior motion. Mild to moderate mitral valve regurgitation. No evidence of mitral stenosis. 5. Left atrial size was mildly dilated. 6. The inferior vena cava is normal in size with greater than 50% respiratory variability, suggesting right atrial pressure of 3 mmHg. 7. Study is consistent with HOCM.  FINDINGS Left Ventricle: Left ventricular ejection fraction, by estimation, is 60 to 65%. The left ventricle has normal function. The left ventricle has no regional wall motion abnormalities. The average left ventricular global longitudinal strain is -26.1 %. Strain was performed and the global longitudinal strain is normal. The left ventricular internal cavity size was normal  in size. There is moderate asymmetric left ventricular hypertrophy of the septal segment. Left ventricular diastolic parameters are consistent with Grade I diastolic dysfunction (impaired relaxation).  Right Ventricle: The right ventricular size is normal. No increase in right ventricular wall thickness. Right ventricular systolic function is normal. There is normal pulmonary artery systolic pressure. The tricuspid regurgitant velocity is 2.19 m/s, and with an assumed right atrial pressure of 3 mmHg, the estimated right ventricular systolic pressure is 22.2 mmHg.  Left Atrium: Left atrial size was mildly dilated.  Right Atrium: Right atrial size was normal in size.  Pericardium: There is no evidence of pericardial effusion.  Mitral Valve: The mitral valve is abnormal. Mild to moderate mitral valve regurgitation. No evidence of mitral valve stenosis.  Tricuspid Valve: The tricuspid valve is normal in structure. Tricuspid valve regurgitation is trivial.  Aortic Valve: The aortic valve is tricuspid. There is mild calcification of the aortic valve. Aortic valve regurgitation is trivial. Aortic regurgitation PHT measures 611 msec. No aortic stenosis is present.  Pulmonic Valve: The pulmonic valve was normal in structure. Pulmonic valve regurgitation is trivial.  Aorta: The aortic root is normal in size and structure.  Venous: The inferior vena cava is normal in size with greater than 50% respiratory variability, suggesting right atrial pressure of 3 mmHg.  IAS/Shunts: No atrial level shunt detected by color flow Doppler.   LEFT VENTRICLE PLAX 2D LVIDd:         4.10 cm   Diastology LVIDs:         1.20 cm   LV e' medial:    8.33 cm/s LV PW:         0.90 cm   LV E/e' medial:  10.0 LV IVS:        1.40 cm   LV e' lateral:   13.40 cm/s LVOT diam:     2.40 cm   LV E/e' lateral: 6.2 LVOT Area:     4.52 cm 2D Longitudinal Strain 2D Strain GLS (A4C):   -30.3 % 2D Strain GLS (A3C):   -24.3  % 2D Strain GLS (A2C):   -23.7 % 2D Strain GLS Avg:     -26.1 %  3D Volume EF: 3D EF:        77 % LV EDV:       156 ml LV ESV:       35 ml LV SV:        121 ml  RIGHT VENTRICLE RV Basal diam:  3.67 cm RV  S prime:     20.10 cm/s TAPSE (M-mode): 2.8 cm RVSP:           22.2 mmHg  LEFT ATRIUM             Index        RIGHT ATRIUM           Index LA diam:        4.50 cm 2.44 cm/m   RA Pressure: 3.00 mmHg LA Vol (A2C):   87.1 ml 47.22 ml/m  RA Area:     16.70 cm LA Vol (A4C):   54.1 ml 29.33 ml/m  RA Volume:   44.40 ml  24.07 ml/m LA Biplane Vol: 72.4 ml 39.25 ml/m AORTIC VALVE AI PHT:      611 msec  AORTA Ao Root diam: 3.00 cm Ao Asc diam:  3.20 cm  MITRAL VALVE                TRICUSPID VALVE MV Area (PHT)  cm          TR Peak grad:   19.2 mmHg MV Decel Time: 258 msec     TR Vmax:        219.00 cm/s MR Peak grad: 161.8 mmHg    Estimated RAP:  3.00 mmHg MR Mean grad: 88.0 mmHg     RVSP:           22.2 mmHg MR Vmax:      636.00 cm/s MR Vmean:     419.0 cm/s    SHUNTS MV E velocity: 83.65 cm/s   Systemic Diam: 2.40 cm MV A velocity: 116.00 cm/s MV E/A ratio:  0.72  Dalton McleanMD Electronically signed by Archer Bear Signature Date/Time: 06/06/2023/3:46:44 PM    Final    MONITORS  LONG TERM MONITOR (3-14 DAYS) 07/07/2022  Narrative   Patient had a minimum heart rate of 39 bpm, maximum heart rate of 167 bpm, and average heart rate of 62 bpm.   Predominant underlying rhythm was sinus rhythm.   Three short runs of SVT, 18 beats at longest.   Isolated PACs were rare (<1.0%).   Isolated PVCs were rare (<1.0%).   No triggered and diary events.  No malignant arrhythmias in the setting of HCM.     CARDIAC MRI  MR CARDIAC MORPHOLOGY W WO CONTRAST 11/13/2020  Narrative CLINICAL DATA:  Clinical question of hypertrophic cardiomyopathy Study assumes HCT of 38.  EXAM: CARDIAC MRI  TECHNIQUE: The patient was scanned on a 1.5 Tesla GE magnet. A  dedicated cardiac coil was used. Functional imaging was done using Fiesta sequences. 2,3, and 4 chamber views were done to assess for RWMA's. Modified Simpson's rule using a short axis stack was used to calculate an ejection fraction on a dedicated work [employer]. The patient received 8 cc of Gadavist . After 10 minutes inversion recovery sequences were used to assess for infiltration and scar tissue.  CONTRAST:  8 cc  of Gadavist   FINDINGS: 1. Normal left ventricular size, with LVEDD 49 mm, and LVEDVi 102 mL/m2.  Intraventricular septal thickness of 16 mm, posterior wall thickness of 7 mm, and septal to posterior ratio of 2.28.  Myocardial mass index 65 g/m2.  Normal left ventricular systolic function (LVEF =56%). There are no regional wall motion abnormalities. There is no systolic anterior motion of the mitral valve on supine assessment.  Left ventricular parametric mapping notable for normal ECV and T2 signal.  There is no late gadolinium enhancement in  the left ventricular myocardium that meets the 5 standard deviation definition.  2. Normal right ventricular size with RVEDVI 98 mL/m2.  Normal right ventricular thickness.  Normal right ventricular systolic function (RVEF =59%). There are no regional wall motion abnormalities or aneurysms.  3.  Normal left and right atrial size.  4. Normal size of the aortic root, ascending aorta and pulmonary artery.  5.  No significant valvular abnormalities.  Tri-leaflet aortic valve.  6.  Normal pericardium.  No pericardial effusion.  7. Grossly, no extracardiac findings. Recommended dedicated study if concerned for non-cardiac pathology.  IMPRESSION: Wall thickness greater than 15 mm without MRI findings suggestive of infiltrative disease.  Study meets imaging criteria for hypertrophic cardiomyopathy.  Gloriann Larger MD   Electronically Signed By: Gloriann Larger M.D. On:  11/13/2020 17:22   ______________________________________________________________________________________________        Recent Labs: No results found for requested labs within last 365 days.  Recent Lipid Panel No results found for: "CHOL", "TRIG", "HDL", "CHOLHDL", "VLDL", "LDLCALC", "LDLDIRECT"   Physical Exam:    VS:  BP (!) 148/66 (BP Location: Right Arm)   Pulse (!) 56   Ht 5\' 6"  (1.676 m)   Wt 74.6 kg   SpO2 98%   BMI 26.53 kg/m     Wt Readings from Last 3 Encounters:  07/10/23 74.6 kg  07/01/22 75 kg  10/13/21 72.6 kg    Gen: no distress   Neck: No JVD Ears: Bilateral Samuel Crock Sign Cardiac: No Rubs or Gallops, 2/6 harsh systolic murmur Valsalva,  normal heart rate and +2 radial pulses Respiratory: Clear to auscultation bilaterally, normal effort, normal  respiratory rate GI: Soft, nontender, non-distended  MS: New L leg edema +1;  moves all extremities Integument: Skin feels warm Neuro:  At time of evaluation, alert and oriented to person/place/time/situation  Psych: Normal affect, patient feels good  ASSESSMENT:    1. Chest pain of uncertain etiology   2. Essential hypertension   3. HOCM (hypertrophic obstructive cardiomyopathy) (HCC)   4. Palpitations      PLAN:    Obstructive hypertrophic cardiomyopathy - NYHA II (light-headed, rarely); CP has resolved Obstructive hypertrophic cardiomyopathy with an induced LVOT gradient of 80. Symptoms include occasional lightheadedness when standing and a single episode of chest pain. Currently NYHA class II. Symptoms are manageable, and he remains active. Discussed potential medical therapy targeting the heart muscle to improve blood flow, indicated for severe obstructive hypertrophic cardiomyopathy with symptoms. However, as he is currently managing well and remains active, no immediate changes to treatment are necessary. Shared decision-making emphasized, with him preferring to maintain current activity levels without  additional medication unless symptoms worsen. - Maintain current activity level and monitor symptoms. - Consider medication targeting heart muscle if symptoms worsen (aficamten start vs mavacamten with verapamil reduction) - Provide educational materials about hypertrophic cardiomyopathy for his daughter. - Discuss genetic testing for him if his daughter if she expresses interest ([facility]- financial issues)  Hypertension Hypertension is well-controlled with home blood pressure readings typically in the 130s/80s range. Occasional elevations noted, but overall management is stable. No changes to current management plan are necessary at this time. - Continue current hypertension management plan. - Monitor blood pressure regularly at home. - had AKI on higher doses or MRA  CP HLD - non ischemic EKG - on statin, LDL goal < 100 without other calcifications - CAC deferred for now; if repeat CP will get CCTA  One year f/u    Medication  Adjustments/Labs and Tests Ordered: Current medicines are reviewed at length with the patient today.  Concerns regarding medicines are outlined above.  Orders Placed This Encounter  Procedures   EKG 12-Lead     No orders of the defined types were placed in this encounter.    Patient Instructions  Medication Instructions:  Your physician recommends that you continue on your current medications as directed. Please refer to the Current Medication list given to you today.  *If you need a refill on your cardiac medications before your next appointment, please call your pharmacy*   Lab Work: NONE If you have labs (blood work) drawn today and your tests are completely normal, you will receive your results only by: MyChart Message (if you have MyChart) OR A paper copy in the mail If you have any lab test that is abnormal or we need to change your treatment, we will call you to review the results.   Testing/Procedures: NONE   Follow-Up: At [facility], you and your health needs are our priority.  As part of our continuing mission to provide you with exceptional heart care, we have created designated Provider Care Teams.  These Care Teams include your primary Cardiologist (physician) and Advanced Practice Providers (APPs -  Physician Assistants and Nurse Practitioners) who all work together to provide you with the care you need, when you need it.  Your next appointment:   1 year(s)  Provider:   Gloriann Larger, MD       Signed, Jann Melody, MD  07/10/2023 11:40 AM     [facility]

## 2023-08-14 ENCOUNTER — Other Ambulatory Visit: Payer: Self-pay | Admitting: Internal Medicine

## 2023-08-15 MED ORDER — HYDRALAZINE HCL 25 MG PO TABS: 25.0000 mg | ORAL_TABLET | Freq: Two times a day (BID) | ORAL | 3 refills | Status: AC

## 2023-08-29 IMAGING — MR MR CARD MORPHOLOGY WO/W CM
45 of 48 series · 45 of 48 positions shown · IV contrast (Contrast agent)
Comparison: none

CLINICAL DATA: Clinical question of hypertrophic cardiomyopathy
Study assumes HCT of 38.

EXAM:
CARDIAC MRI
TECHNIQUE: The patient was scanned on a 1.5 Tesla GE magnet. A dedicated
cardiac coil was used. Functional imaging was done using [employer]iest[employer]
sequences. [DATE], and 4 chamber views were done to assess for RWMA's.
Modified Omnilife rule using a short axis stack was used to
calculate an ejection fraction on a dedicated work station using
[employer] software. The patient received 8 cc of Gadavist. After 10
minutes inversion recovery sequences were used to assess for
infiltration and scar tissue.
CONTRAST:  8 cc  of Gadavist

[Series 4: [id number]_bh · axial · 8.0mm · 1.41mm/px · 1 of 16 slices shown]
[im 1/16]
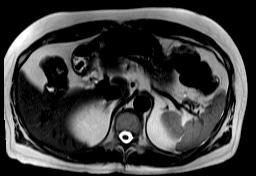

[Series 9: bSSFP · oblique · 8.0mm · 1.61mm/px · 1 of 25 slices shown (1 of 19)]
[im 1/25]
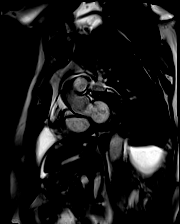

[Series 10: bSSFP · oblique · 8.0mm · 1.61mm/px · 1 of 25 slices shown (2 of 19)]
[im 1/25]
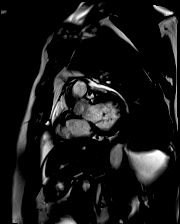

[Series 11: bSSFP · oblique · 8.0mm · 1.61mm/px · 1 of 25 slices shown (3 of 19)]
[im 1/25]
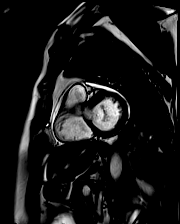

[Series 12: bSSFP · oblique · 8.0mm · 1.61mm/px · 1 of 25 slices shown (4 of 19)]
[im 1/25]
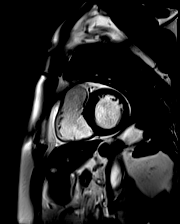

[Series 13: bSSFP · oblique · 8.0mm · 1.61mm/px · 1 of 25 slices shown (5 of 19)]
[im 1/25]
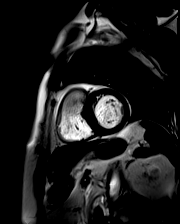

[Series 14: bSSFP · oblique · 8.0mm · 1.61mm/px · 1 of 25 slices shown (6 of 19)]
[im 1/25]
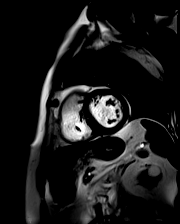

[Series 15: bSSFP · oblique · 8.0mm · 1.61mm/px · 1 of 25 slices shown (7 of 19)]
[im 1/25]
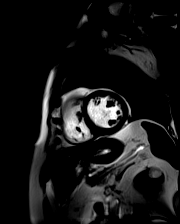

[Series 16: bSSFP · oblique · 8.0mm · 1.61mm/px · 1 of 25 slices shown (8 of 19)]
[im 1/25]
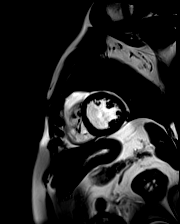

[Series 17: bSSFP · oblique · 8.0mm · 1.61mm/px · 1 of 25 slices shown (9 of 19)]
[im 1/25]
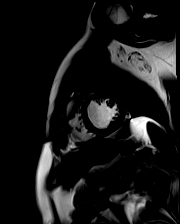

[Series 18: bSSFP · oblique · 8.0mm · 1.61mm/px · 1 of 25 slices shown (10 of 19)]
[im 1/25]
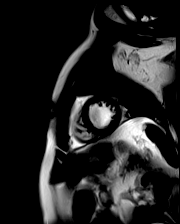

[Series 19: bSSFP · oblique · 8.0mm · 1.61mm/px · 1 of 25 slices shown (11 of 19)]
[im 1/25]
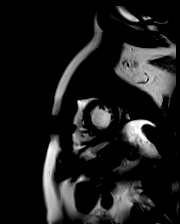

[Series 20: bSSFP · oblique · 8.0mm · 1.61mm/px · 1 of 25 slices shown (12 of 19)]
[im 1/25]
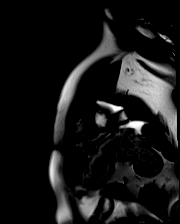

[Series 21: bSSFP · oblique · 8.0mm · 1.61mm/px · 1 of 25 slices shown (13 of 19)]
[im 1/25]
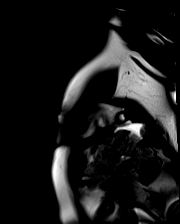

[Series 22: bSSFP · oblique · 8.0mm · 1.61mm/px · 1 of 25 slices shown (14 of 19)]
[im 1/25]
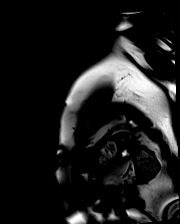

[Series 23: bSSFP · oblique · 8.0mm · 1.61mm/px · 1 of 25 slices shown (15 of 19)]
[im 1/25]
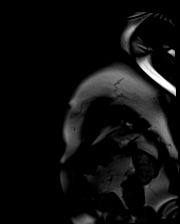

[Series 24: bSSFP · oblique · 6.0mm · 1.41mm/px · 1 of 25 slices shown (16 of 19)]
[im 1/25]
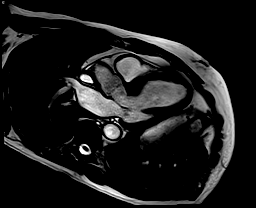

[Series 25: bSSFP · oblique · 6.0mm · 1.41mm/px · 1 of 25 slices shown (17 of 19)]
[im 1/25]
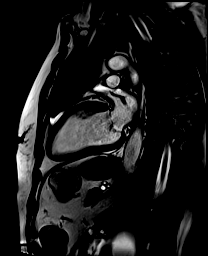

[Series 26: bSSFP · axial · 6.0mm · 1.41mm/px · 1 of 25 slices shown (18 of 19)]
[im 1/25]
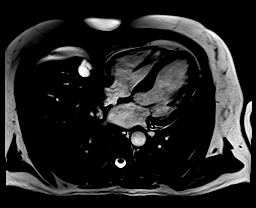

[Series 27: (id)_long_t1 · oblique · 8.0mm · 1.56mm/px · 1 of 24 slices shown]
[im 1/24]
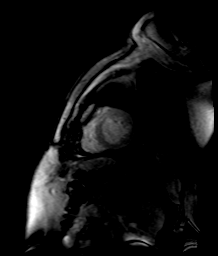

[Series 28: (id)_long_t1_moco · oblique · 8.0mm · 1.56mm/px · 1 of 24 slices shown]
[im 1/24]
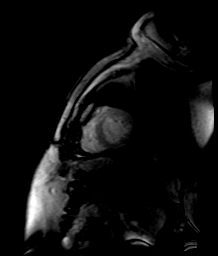

[Series 29: (id)_long_t1_moco_t1 · oblique · 8.0mm · 1.56mm/px · 1 of 6 slices shown]
[im 1/6]
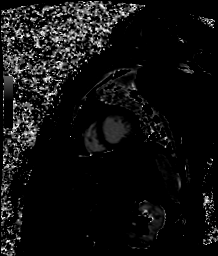

[Series 31: (id)_trufi · oblique · 8.0mm · 2.08mm/px · 1 of 9 slices shown]
[im 1/9]
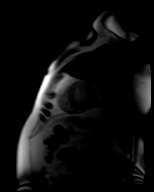

[Series 32: (id)_trufi_moco · oblique · 8.0mm · 2.08mm/px · 1 of 9 slices shown]
[im 1/9]
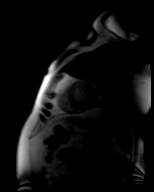

[Series 33: (id)_trufi_moco_t2 · oblique · 8.0mm · 2.08mm/px · 1 of 3 slices shown]
[im 1/3]
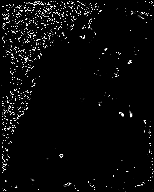

[Series 35: pre short axis · oblique · non-contrast · 8.0mm · 2.25mm/px · 1 of 10 slices shown (1 of 6)]
[im 1/10]
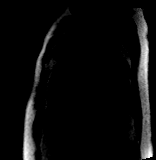

[Series 36: pre short axis · oblique · non-contrast · 8.0mm · 2.25mm/px · 1 of 10 slices shown (2 of 6)]
[im 1/10]
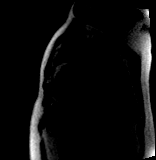

[Series 37: pre short axis · oblique · non-contrast · 8.0mm · 2.25mm/px · 1 of 10 slices shown (3 of 6)]
[im 1/10]
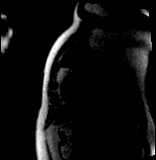

[Series 38: pre short axis · oblique · non-contrast · 8.0mm · 2.25mm/px · 1 of 10 slices shown (4 of 6)]
[im 1/10]
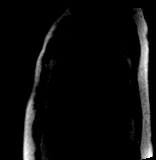

[Series 39: pre short axis · oblique · non-contrast · 8.0mm · 2.25mm/px · 1 of 10 slices shown (5 of 6)]
[im 1/10]
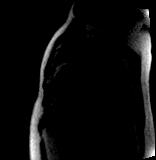

[Series 40: pre short axis · oblique · non-contrast · 8.0mm · 2.25mm/px · 1 of 10 slices shown (6 of 6)]
[im 1/10]
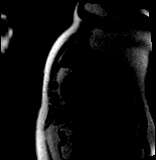

[Series 41: rest short axis · oblique · 8.0mm · 2.25mm/px · 1 of 60 slices shown (1 of 6)]
[im 1/60]
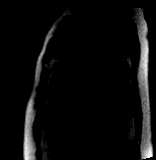

[Series 42: rest short axis · oblique · 8.0mm · 2.25mm/px · 1 of 60 slices shown (2 of 6)]
[im 1/60]
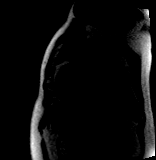

[Series 43: rest short axis · oblique · 8.0mm · 2.25mm/px · 1 of 60 slices shown (3 of 6)]
[im 1/60]
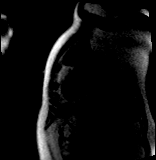

[Series 44: rest short axis · oblique · 8.0mm · 2.25mm/px · 1 of 60 slices shown (4 of 6)]
[im 1/60]
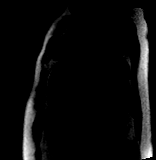

[Series 45: rest short axis · oblique · 8.0mm · 2.25mm/px · 1 of 60 slices shown (5 of 6)]
[im 1/60]
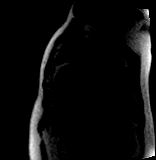

[Series 46: rest short axis · oblique · 8.0mm · 2.25mm/px · 1 of 60 slices shown (6 of 6)]
[im 1/60]
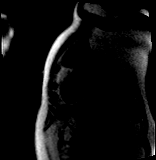

[Series 47: bSSFP · coronal · 6.0mm · 1.41mm/px · 1 of 25 slices shown (19 of 19)]
[im 1/25]
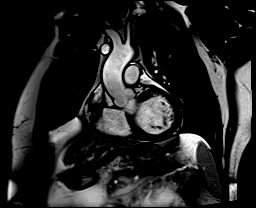

[Series 48: aortic valve cine · oblique · 6.0mm · 1.41mm/px · 1 of 25 slices shown]
[im 1/25]
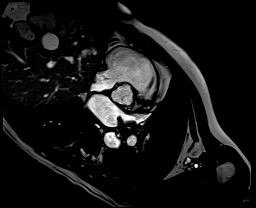

[Series 49: cine rvit · coronal · 6.0mm · 1.41mm/px · 1 of 25 slices shown]
[im 1/25]
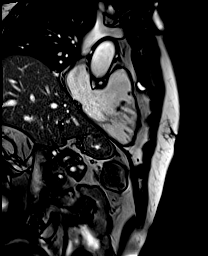

[Series 50: cine rvot · sagittal · 6.0mm · 1.41mm/px · 1 of 25 slices shown]
[im 1/25]
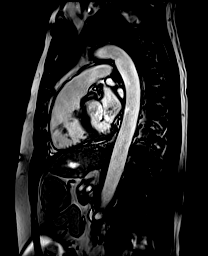

[Series 52: lge_single shot sa · oblique · 8.0mm · 2.08mm/px · 1 of 18 slices shown (1 of 2)]
[im 1/18]
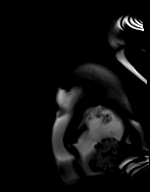

[Series 53: lge_single shot sa · oblique · 8.0mm · 2.08mm/px · 1 of 18 slices shown (2 of 2)]
[im 1/18]
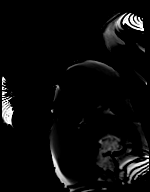

[Series 56: lge_single shot 4 · axial · 6.0mm · 1.98mm/px · 1 of 1 slices shown (1 of 2)]
[im 1/1]
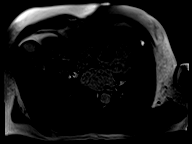

[Series 57: lge_single shot 4 · axial · 6.0mm · 1.98mm/px · 1 of 1 slices shown (2 of 2)]
[im 1/1]
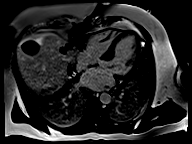

[45 of 48 positions shown; findings below may reference images not displayed]

FINDINGS: 1. Normal left ventricular size, with LVEDD 49 mm, and LVEDVi 102
mL/m2.

Intraventricular septal thickness of 16 mm, posterior wall thickness
of 7 mm, and septal to posterior ratio of 2.28.

Myocardial mass index 65 g/m2.

Normal left ventricular systolic function (LVEF =56%). There are no
regional wall motion abnormalities. There is no systolic anterior
motion of the mitral valve on supine assessment.

Left ventricular parametric mapping notable for normal ECV and T2
signal.

There is no late gadolinium enhancement in the left ventricular
myocardium that meets the 5 standard deviation definition.

2. Normal right ventricular size with RVEDVI 98 mL/m2.

Normal right ventricular thickness.

Normal right ventricular systolic function (RVEF =59%). There are no
regional wall motion abnormalities or aneurysms.

3.  Normal left and right atrial size.

4. Normal size of the aortic root, ascending aorta and pulmonary
artery.

5.  No significant valvular abnormalities.

Tri-leaflet aortic valve.

6.  Normal pericardium.  No pericardial effusion.

7. Grossly, no extracardiac findings. Recommended dedicated study if
concerned for non-cardiac pathology.
IMPRESSION: Wall thickness greater than 15 mm without MRI findings suggestive of
infiltrative disease.

Study meets imaging criteria for hypertrophic cardiomyopathy.

## 2023-08-30 ENCOUNTER — Other Ambulatory Visit: Payer: Self-pay | Admitting: Urology

## 2023-08-30 DIAGNOSIS — C61 Malignant neoplasm of prostate: Secondary | ICD-10-CM

## 2023-09-12 DIAGNOSIS — H25043 Posterior subcapsular polar age-related cataract, bilateral: Secondary | ICD-10-CM | POA: Diagnosis not present

## 2023-09-12 DIAGNOSIS — H25013 Cortical age-related cataract, bilateral: Secondary | ICD-10-CM | POA: Diagnosis not present

## 2023-09-12 DIAGNOSIS — H2511 Age-related nuclear cataract, right eye: Secondary | ICD-10-CM | POA: Diagnosis not present

## 2023-09-12 DIAGNOSIS — H2513 Age-related nuclear cataract, bilateral: Secondary | ICD-10-CM | POA: Diagnosis not present

## 2023-09-12 DIAGNOSIS — H18413 Arcus senilis, bilateral: Secondary | ICD-10-CM | POA: Diagnosis not present

## 2023-09-19 ENCOUNTER — Other Ambulatory Visit: Payer: Self-pay

## 2023-09-19 MED ORDER — SPIRONOLACTONE 25 MG PO TABS: 25.0000 mg | ORAL_TABLET | Freq: Every day | ORAL | 2 refills | Status: AC

## 2023-10-17 DIAGNOSIS — R7303 Prediabetes: Secondary | ICD-10-CM | POA: Diagnosis not present

## 2023-10-19 ENCOUNTER — Ambulatory Visit
Admission: RE | Admit: 2023-10-19 | Discharge: 2023-10-19 | Disposition: A | Discharge status: Home / Self Care | Source: Ambulatory Visit | Attending: Urology | Admitting: Urology

## 2023-10-19 DIAGNOSIS — C61 Malignant neoplasm of prostate: Secondary | ICD-10-CM

## 2023-10-19 MED ORDER — GADOPICLENOL 0.5 MMOL/ML IV SOLN
7.0000 mL | Freq: Once | INTRAVENOUS | Status: AC | PRN
  Administered 2023-10-19: 7 mL via INTRAVENOUS

## 2023-11-06 DIAGNOSIS — C61 Malignant neoplasm of prostate: Secondary | ICD-10-CM | POA: Diagnosis not present

## 2023-11-16 DIAGNOSIS — R972 Elevated prostate specific antigen [PSA]: Secondary | ICD-10-CM | POA: Diagnosis not present

## 2023-11-16 DIAGNOSIS — C61 Malignant neoplasm of prostate: Secondary | ICD-10-CM | POA: Diagnosis not present

## 2024-03-19 NOTE — Progress Notes (Signed)
 Logan Wu                                          MRN: [id number]   03/19/2024   The [facility] Quality Team Specialist reviewed this patient medical record for the purposes of chart review for care gap closure. The following were reviewed: chart review for care gap closure-controlling blood pressure.    [facility] Quality Team
